# Patient Record
Sex: Male | Born: 1995
Health system: Southern US, Community
[De-identification: ages and names within clinical notes are randomized; demographics above are authoritative.]

## PROBLEM LIST (undated history)

## (undated) DIAGNOSIS — F32A Depression, unspecified: Secondary | ICD-10-CM

## (undated) DIAGNOSIS — F5105 Insomnia due to other mental disorder: Secondary | ICD-10-CM

## (undated) DIAGNOSIS — F419 Anxiety disorder, unspecified: Secondary | ICD-10-CM

## (undated) DIAGNOSIS — J302 Other seasonal allergic rhinitis: Secondary | ICD-10-CM

## (undated) DIAGNOSIS — F909 Attention-deficit hyperactivity disorder, unspecified type: Secondary | ICD-10-CM

## (undated) DIAGNOSIS — F329 Major depressive disorder, single episode, unspecified: Secondary | ICD-10-CM

## (undated) DIAGNOSIS — G43909 Migraine, unspecified, not intractable, without status migrainosus: Secondary | ICD-10-CM

## (undated) DIAGNOSIS — G4719 Other hypersomnia: Principal | ICD-10-CM

## (undated) HISTORY — DX: Other hypersomnia: G47.19

## (undated) HISTORY — DX: Insomnia due to other mental disorder: F51.05

---
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
## 2014-01-11 ENCOUNTER — Encounter (HOSPITAL_COMMUNITY): Payer: Self-pay | Admitting: *Deleted

## 2014-01-11 ENCOUNTER — Emergency Department (HOSPITAL_COMMUNITY)
Admission: EM | Admit: 2014-01-11 | Discharge: 2014-01-11 | Disposition: A | Discharge status: Home / Self Care | Payer: Federal, State, Local not specified - PPO | Attending: Emergency Medicine | Admitting: Emergency Medicine

## 2014-01-11 DIAGNOSIS — R1032 Left lower quadrant pain: Secondary | ICD-10-CM | POA: Diagnosis not present

## 2014-01-11 DIAGNOSIS — K5900 Constipation, unspecified: Secondary | ICD-10-CM | POA: Insufficient documentation

## 2014-01-11 DIAGNOSIS — R51 Headache: Secondary | ICD-10-CM | POA: Diagnosis not present

## 2014-01-11 DIAGNOSIS — R197 Diarrhea, unspecified: Secondary | ICD-10-CM | POA: Insufficient documentation

## 2014-01-11 DIAGNOSIS — Z8659 Personal history of other mental and behavioral disorders: Secondary | ICD-10-CM | POA: Insufficient documentation

## 2014-01-11 DIAGNOSIS — Z8669 Personal history of other diseases of the nervous system and sense organs: Secondary | ICD-10-CM | POA: Insufficient documentation

## 2014-01-11 HISTORY — DX: Depression, unspecified: F32.A

## 2014-01-11 HISTORY — DX: Major depressive disorder, single episode, unspecified: F32.9

## 2014-01-11 HISTORY — DX: Other seasonal allergic rhinitis: J30.2

## 2014-01-11 HISTORY — DX: Attention-deficit hyperactivity disorder, unspecified type: F90.9

## 2014-01-11 HISTORY — DX: Anxiety disorder, unspecified: F41.9

## 2014-01-11 HISTORY — DX: Migraine, unspecified, not intractable, without status migrainosus: G43.909

## 2014-01-11 LAB — CBC WITH DIFFERENTIAL/PLATELET
BASOS ABS: 0 10*3/uL (ref 0.0–0.1)
BASOS PCT: 0 % (ref 0–1)
EOS PCT: 1 % (ref 0–5)
Eosinophils Absolute: 0.1 10*3/uL (ref 0.0–0.7)
HEMATOCRIT: 43.7 % (ref 39.0–52.0)
Hemoglobin: 15.8 g/dL (ref 13.0–17.0)
LYMPHS ABS: 2.4 10*3/uL (ref 0.7–4.0)
LYMPHS PCT: 31 % (ref 12–46)
MCH: 31.5 pg (ref 26.0–34.0)
MCHC: 36.2 g/dL — ABNORMAL HIGH (ref 30.0–36.0)
MCV: 87.2 fL (ref 78.0–100.0)
MONOS PCT: 8 % (ref 3–12)
Monocytes Absolute: 0.6 10*3/uL (ref 0.1–1.0)
Neutro Abs: 4.6 10*3/uL (ref 1.7–7.7)
Neutrophils Relative %: 60 % (ref 43–77)
PLATELETS: 278 10*3/uL (ref 150–400)
RBC: 5.01 MIL/uL (ref 4.22–5.81)
RDW: 12.1 % (ref 11.5–15.5)
WBC: 7.7 10*3/uL (ref 4.0–10.5)

## 2014-01-11 LAB — URINALYSIS, ROUTINE W REFLEX MICROSCOPIC
Bilirubin Urine: NEGATIVE
GLUCOSE, UA: NEGATIVE mg/dL
HGB URINE DIPSTICK: NEGATIVE
Ketones, ur: NEGATIVE mg/dL
Leukocytes, UA: NEGATIVE
NITRITE: NEGATIVE
Protein, ur: NEGATIVE mg/dL
SPECIFIC GRAVITY, URINE: 1.008 (ref 1.005–1.030)
Urobilinogen, UA: 0.2 mg/dL (ref 0.0–1.0)
pH: 6.5 (ref 5.0–8.0)

## 2014-01-11 LAB — COMPREHENSIVE METABOLIC PANEL
ALBUMIN: 4.8 g/dL (ref 3.5–5.2)
ALK PHOS: 64 U/L (ref 39–117)
ALT: 14 U/L (ref 0–53)
AST: 18 U/L (ref 0–37)
Anion gap: 15 (ref 5–15)
BUN: 9 mg/dL (ref 6–23)
CALCIUM: 9.6 mg/dL (ref 8.4–10.5)
CO2: 24 meq/L (ref 19–32)
CREATININE: 0.85 mg/dL (ref 0.50–1.35)
Chloride: 101 mEq/L (ref 96–112)
Glucose, Bld: 97 mg/dL (ref 70–99)
Potassium: 3.4 mEq/L — ABNORMAL LOW (ref 3.7–5.3)
Sodium: 140 mEq/L (ref 137–147)
TOTAL PROTEIN: 8 g/dL (ref 6.0–8.3)
Total Bilirubin: 1.4 mg/dL — ABNORMAL HIGH (ref 0.3–1.2)

## 2014-01-11 LAB — LIPASE, BLOOD: LIPASE: 17 U/L (ref 11–59)

## 2014-01-11 MED ORDER — IBUPROFEN 800 MG PO TABS
800.0000 mg | ORAL_TABLET | Freq: Once | ORAL | Status: AC
  Administered 2014-01-11: 800 mg via ORAL
  Filled 2014-01-11: qty 1

## 2014-01-11 NOTE — ED Notes (Signed)
Pt reports sudden onset of LLQ pain that started this afternoon around 3pm. Followed by feeling weak, lightheaded and headache. Denies n/v/d or urinary symptoms.

## 2014-01-11 NOTE — ED Provider Notes (Signed)
CSN: [id number]     Arrival date & time 01/11/14  1828 History   First MD Initiated Contact with Patient 01/11/14 2055     Chief Complaint  Patient presents with  . Abdominal Pain  . Headache     (Consider location/radiation/quality/duration/timing/severity/associated sxs/prior Treatment) Patient is a 18 y.o. male presenting with abdominal pain and headaches. The history is provided by the patient. No language interpreter was used.  Abdominal Pain Pain location:  LLQ Pain quality: sharp   Pain radiates to:  Does not radiate Pain severity:  Severe Onset quality:  Sudden Duration:  7 hours Timing:  Intermittent Progression:  Improving Chronicity:  New Context: not previous surgeries, not recent illness, not recent sexual activity, not suspicious food intake and not trauma   Relieved by:  None tried Worsened by:  Nothing tried Associated symptoms: constipation and diarrhea   Associated symptoms: no anorexia, no chest pain, no chills, no dysuria, no fever, no hematemesis, no hematochezia, no hematuria, no melena, no nausea, no shortness of breath and no vomiting   Risk factors: has not had multiple surgeries and no NSAID use   Headache Pain location:  Generalized Quality:  Dull Radiates to:  Does not radiate Severity currently:  0/10 Onset quality:  Gradual Progression:  Resolved Chronicity:  Recurrent Similar to prior headaches: yes   Associated symptoms: abdominal pain and diarrhea   Associated symptoms: no fever, no myalgias, no nausea, no neck pain and no vomiting     Past Medical History  Diagnosis Date  . Migraine   . Anxiety   . Depression   . ADHD (attention deficit hyperactivity disorder)   . Seasonal allergies    History reviewed. No pertinent past surgical history. History reviewed. No pertinent family history. History  Substance Use Topics  . Smoking status: Not on file  . Smokeless tobacco: Not on file  . Alcohol Use: No    Review of Systems   Constitutional: Negative for fever, chills and appetite change.  Eyes: Negative for visual disturbance.  Respiratory: Negative for chest tightness and shortness of breath.   Cardiovascular: Negative for chest pain and palpitations.  Gastrointestinal: Positive for abdominal pain, diarrhea and constipation. Negative for nausea, vomiting, blood in stool, melena, hematochezia, abdominal distention, rectal pain, anorexia and hematemesis.  Genitourinary: Negative for dysuria, hematuria, discharge, penile swelling, scrotal swelling and testicular pain.  Musculoskeletal: Negative for myalgias, arthralgias and neck pain.  Neurological: Positive for light-headedness (resolved) and headaches.  All other systems reviewed and are negative.     Allergies  Review of patient's allergies indicates no known allergies.  Home Medications   Prior to Admission medications   Not on File   BP 123/75 mmHg  Pulse 81  Temp(Src) 98.1 F (36.7 C) (Oral)  Resp 15  SpO2 99% Physical Exam  Constitutional: He is oriented to person, place, and time. He appears well-developed and well-nourished. No distress.  HENT:  Head: Normocephalic.  Mouth/Throat: Oropharynx is clear and moist.  Eyes: EOM are normal. Pupils are equal, round, and reactive to light.  Neck: Neck supple.  Cardiovascular: Normal rate, regular rhythm and intact distal pulses.   Pulmonary/Chest: Effort normal and breath sounds normal. He exhibits no tenderness.  Abdominal: Soft. He exhibits no distension. There is tenderness (mild) in the left lower quadrant. There is no rigidity, no rebound, no guarding and no CVA tenderness. No hernia.  Genitourinary: Testes normal and penis normal. Cremasteric reflex is present. Circumcised.  Neurological: He is alert and oriented  to person, place, and time. He has normal strength. No cranial nerve deficit or sensory deficit. Coordination and gait normal. GCS eye subscore is 4. GCS verbal subscore is 5. GCS  motor subscore is 6.  Skin: Skin is warm and dry.  Vitals reviewed.   ED Course  Procedures (including critical care time) Labs Review Labs Reviewed  CBC WITH DIFFERENTIAL - Abnormal; Notable for the following:    MCHC 36.2 (*)    All other components within normal limits  COMPREHENSIVE METABOLIC PANEL - Abnormal; Notable for the following:    Potassium 3.4 (*)    Total Bilirubin 1.4 (*)    All other components within normal limits  LIPASE, BLOOD  URINALYSIS, ROUTINE W REFLEX MICROSCOPIC    Imaging Review No results found.   EKG Interpretation None      MDM   Final diagnoses:  LLQ abdominal pain    18 y/o male with LLQ abdominal pain. Now improved. No vomiting, fever, testicular complaints. Does have h/o intermittent constipation with hard BM today. Benign abdominal exam with very minimal tenderness in LLQ. Normal GU exam. Imaging unlikely to add to evaluation and felt nonindicated. Labs unremarkable. No s/s of nephrolithiasis. Recommended miralax for constipation. Appropriately screened and will f/u with PCP if symptoms persist or return to ED. Questions answered and family in agreement.     Abagail KitchensMegan Jlee Harkless, MD 01/12/14 1550  Nelia Shiobert L Beaton, MD 01/13/14 [phone]

## 2014-01-11 NOTE — Discharge Instructions (Signed)

## 2014-07-05 ENCOUNTER — Ambulatory Visit (INDEPENDENT_AMBULATORY_CARE_PROVIDER_SITE_OTHER): Payer: Federal, State, Local not specified - PPO | Admitting: Neurology

## 2014-07-05 ENCOUNTER — Encounter: Payer: Self-pay | Admitting: Neurology

## 2014-07-05 VITALS — BP 111/71 | HR 90 | Temp 97.8°F | Resp 18 | Ht 70.08 in | Wt 151.5 lb

## 2014-07-05 DIAGNOSIS — F5105 Insomnia due to other mental disorder: Secondary | ICD-10-CM | POA: Diagnosis not present

## 2014-07-05 DIAGNOSIS — G4719 Other hypersomnia: Secondary | ICD-10-CM | POA: Diagnosis not present

## 2014-07-05 DIAGNOSIS — F489 Nonpsychotic mental disorder, unspecified: Secondary | ICD-10-CM

## 2014-07-05 DIAGNOSIS — R0683 Snoring: Secondary | ICD-10-CM | POA: Diagnosis not present

## 2014-07-05 HISTORY — DX: Other hypersomnia: G47.19

## 2014-07-05 HISTORY — DX: Insomnia due to other mental disorder: F51.05

## 2014-07-05 NOTE — Progress Notes (Signed)
SLEEP MEDICINE CLINIC   Provider:  Melvyn Novasarmen  Esli Jernigan, M D  Referring Provider: Tally JoeSwayne, David, MD Primary Care Physician:  [name],DAVID W, MD  Chief Complaint  Patient presents with  . Fatigue  . Insomnia    HPI:  Jeffery Johnson is a 19 y.o. male , seen here with his mother  as a referral from [facility] Dr. Phillip HealJane Steiner, his PCP is Dr. Azucena CecilSwayne , for insomnia.  Mr. Jeffery Johnson presents today in a hypersonic state but this is due to not sleeping last night. Apparently he has had trouble with insomnia trouble to initiate sleep and maintain sleep for quite a while. He has been treated since his youth for anxiety disorder. He reports that he often flurries or has racing thoughts keeping him from finally being able to go to sleep, but often once he is asleep he wakes up earlier than desired , too. His mother reports that he was always a poor sleeper, he had trouble sleeping as an infant. The anxiety disorder manifested itself already doing an elementary school age. Recently he had been prescribed doxepin to help him induce sleep. He had not felt that up to 10 mg of melatonin were of great help in this regard. He states that the doxepin helps him to sleep but he has morning grogginess for about 2 hours after he rises. He works late shift ,   His sleep habits are as follows; The patient has his own bedroom, he uses blackout curtains to eliminate light and flux, he closes the door at night usually his bedroom would be described as core, quiet and dark. He usually goes to the bedroom at about 21 and 3 AM, he may go to sleep finally at 5 AM on some days. He will sleep through until 2 or 3 in the afternoon. his work ends at [employer] PM and he usually visits his girlfriend after that. He denies any alcohol use or caffeine use during work days. He gets about 6 hor to 8 hours of sleep. He snores, or at least has audible breathing.  His girlfriend reported him to have apnea.  On days that he has to work earlier he  would go home directly after work and then it'll take him only 15-30 minutes to fall asleep. When he feels [name] or worried about something he has great trouble to go to sleep which is expected. He actually had more trouble to get a good nights rest when he was in school and he has now. I do think that the medication seems to work rather well for him at this time and that he should continue it. Under the use of doxepin he has not had fragmented sleep. But he used to have sleep fragmentation before.  His father has insomnia and uses Ambien every night.     Review of Systems: Out of a complete 14 system review, the patient complains of only the following symptoms, and all other reviewed systems are negative. Insomia, depression, anxiety, racing thoughts.  snoring    Epworth score 8  , Fatigue severity score 32  , depression score N/A    History   Social History  . Marital Status: Single    Spouse Name: N/A  . Number of Children: N/A  . Years of Education: N/A   Occupational History  . Not on file.   Social History Main Topics  . Smoking status: Never Smoker   . Smokeless tobacco: Not on file  . Alcohol Use: No  Comment: 1 drink a week  . Drug Use: No  . Sexual Activity: Not on file   Other Topics Concern  . Not on file   Social History Narrative   2 drinks of caffeine a week.    Family History  Problem Relation Age of Onset  . Breast cancer Mother   . Skin cancer Mother   . Stroke Maternal Grandfather   . Heart disease Maternal Grandfather   . Diabetes Maternal Grandfather   . Stroke Paternal Grandfather   . Heart disease Paternal Grandfather     Past Medical History  Diagnosis Date  . Migraine   . Anxiety   . Depression   . ADHD (attention deficit hyperactivity disorder)   . Seasonal allergies   . Excessive daytime sleepiness 07/05/2014  . Insomnia due to mental condition 07/05/2014    History reviewed. No pertinent past surgical history.  Current  Outpatient Prescriptions  Medication Sig Dispense Refill  . busPIRone (BUSPAR) 30 MG tablet Take 30 mg by mouth every morning.    . clonazePAM (KLONOPIN) 0.5 MG tablet Take 0.5 mg by mouth 2 (two) times daily as needed for anxiety.    Marland Kitchen doxepin (SINEQUAN) 10 MG capsule Take 10 mg by mouth at bedtime. 1-2 tabs about 1 hour before bed, may increase up to 3 tabs qhs for sleep    . escitalopram (LEXAPRO) 20 MG tablet Take 30 mg by mouth every morning. Take 1 and 1/2 tab ( ) daily    . lactobacillus acidophilus (BACID) TABS tablet Take 2 tablets by mouth 3 (three) times daily.    Marland Kitchen lisdexamfetamine (VYVANSE) 40 MG capsule Take 40 mg by mouth every morning.    . Melatonin 1 MG TABS Take 1 tablet by mouth at bedtime as needed (sleep).    . montelukast (SINGULAIR) 10 MG tablet Take 10 mg by mouth at bedtime.    . Multiple Vitamin (MULTIVITAMIN) tablet Take 1 tablet by mouth daily.    Marland Kitchen ZOLMitriptan (ZOMIG) 2.5 MG tablet Take 2.5 mg by mouth as needed for migraine or headache.    . zonisamide (ZONEGRAN) 100 MG capsule Take 300 mg by mouth at bedtime.     No current facility-administered medications for this visit.    Allergies as of 07/05/2014  . (No Known Allergies)    Vitals: BP 111/71 mmHg  Pulse 90  Temp(Src) 97.8 F (36.6 C) (Oral)  Resp 18  Ht 5' 10.08" (1.78 m)  Wt 151 lb 8 oz (68.72 kg)  BMI 21.69 kg/m2 Last Weight:  Wt Readings from Last 1 Encounters:  07/05/14 151 lb 8 oz (68.72 kg)       Last Height:   Ht Readings from Last 1 Encounters:  07/05/14 5' 10.08" (1.78 m)    Physical exam:  General: The patient is awake, alert and appears not in acute distress. The patient is well groomed. Head: Normocephalic, atraumatic. Neck is supple. Mallampati 2   neck circumference: 14.5 . Nasal airflow unrestricted , TMJ is not  evident . Retrognathia is seen.  He wore braces .  Cardiovascular:  Regular rate and rhythm , without  murmurs or carotid bruit, and without distended neck  veins. Respiratory: Lungs are clear to auscultation. Skin:  Without evidence of edema, or rash Trunk: BMI is normal, as is  posture.  Neurologic exam : The patient is awake and alert, oriented to place and time.   Memory subjective described as intact. There is a normal attention span & concentration ability.  Speech is fluent without dysarthria, dysphonia or aphasia.  Mood and affect are appropriate.  Cranial nerves: Pupils are equal and briskly reactive to light. Funduscopic exam without  evidence of pallor or edema. Extraocular movements  in vertical and horizontal planes intact and without nystagmus. Visual fields by finger perimetry are intact. Hearing to finger rub intact.  Facial sensation intact to fine touch. Facial motor strength is symmetric and tongue and uvula move midline.  Motor exam:   Normal tone, muscle bulk and symmetric ,strength in all extremities.  Sensory:  Fine touch, pinprick and vibration were tested in all extremities. Proprioception is  normal.  Coordination: Rapid alternating movements in the fingers/hands is normal. Finger-to-nose maneuver normal without evidence of ataxia, dysmetria or tremor.  Gait and station: Patient walks without assistive device and is able unassisted to climb up to the exam table.  Strength within normal limits. Stance is stable and normal. Tandem gait is unfragmented. Romberg  negative.  Deep tendon reflexes: in the  upper and lower extremities are symmetric, brisk  and intact. Babinski maneuver response is downgoing.   Assessment:  After physical and neurologic examination, review of laboratory studies, imaging, neurophysiology testing and pre-existing records, assessment is   1) I think Mr. Zahradnik insomnia is strongly related to his underlying anxiety. Doxepin treats anxiety and depression as well as insomnia and he has responded very well to it. I would like for him to stay on this medication it is not addictive, he does not  complain of a dry mouth and the grogginess he seems to be able to handle. In addition once he is asleep he seems to have less fragmented sleep taking this medication. 2) His girlfriend has reported him to snore and she has witnessed some apneas or irregular breathing and his mother states that he even as an infant had some apneic episodes. It may be worse monitoring this I'm not sure if we should do a home sleep test or an in lab sleep test. I would usually prefer for somebody who has a irregular sleep/ shift work to do a home sleep test  As it is easier to arrange. [employer].    The patient was advised of the nature of the diagnosed sleep disorder , the treatment options and risks for general a health and wellness arising from not treating the condition. Visit duration was 35 minutes.   Plan:  Treatment plan and additional workup : If permitted by the federal [employer] plan I would like for this patient to have an apneas screening test by home sleep test. As to his insomnia I doubt that that is a organic component of snoring or apnea that would cause it. Dr. sinus medication choice seems to have treated the insomnia rather well and I see no reason for him to change from that.      Porfirio Mylar Latash Nouri MD  07/05/2014

## 2014-07-05 NOTE — Patient Instructions (Signed)
Insomnia Insomnia is frequent trouble falling and/or staying asleep. Insomnia can be a long term problem or a short term problem. Both are common. Insomnia can be a short term problem when the wakefulness is related to a certain stress or worry. Long term insomnia is often related to ongoing stress during waking hours and/or poor sleeping habits. Overtime, sleep deprivation itself can make the problem worse. Every little thing feels more severe because you are overtired and your ability to cope is decreased. CAUSES   Stress, anxiety, and depression.  Poor sleeping habits.  Distractions such as TV in the bedroom.  Naps close to bedtime.  Engaging in emotionally charged conversations before bed.  Technical reading before sleep.  Alcohol and other sedatives. They may make the problem worse. They can hurt normal sleep patterns and normal dream activity.  Stimulants such as caffeine for several hours prior to bedtime.  Pain syndromes and shortness of breath can cause insomnia.  Exercise late at night.  Changing time zones may cause sleeping problems (jet lag). It is sometimes helpful to have someone observe your sleeping patterns. They should look for periods of not breathing during the night (sleep apnea). They should also look to see how long those periods last. If you live alone or observers are uncertain, you can also be observed at a sleep clinic where your sleep patterns will be professionally monitored. Sleep apnea requires a checkup and treatment. Give your caregivers your medical history. Give your caregivers observations your family has made about your sleep.  SYMPTOMS   Not feeling rested in the morning.  Anxiety and restlessness at bedtime.  Difficulty falling and staying asleep. TREATMENT   Your caregiver may prescribe treatment for an underlying medical disorders. Your caregiver can give advice or help if you are using alcohol or other drugs for self-medication. Treatment  of underlying problems will usually eliminate insomnia problems.  Medications can be prescribed for short time use. They are generally not recommended for lengthy use.  Over-the-counter sleep medicines are not recommended for lengthy use. They can be habit forming.  You can promote easier sleeping by making lifestyle changes such as:  Using relaxation techniques that help with breathing and reduce muscle tension.  Exercising earlier in the day.  Changing your diet and the time of your last meal. No night time snacks.  Establish a regular time to go to bed.  Counseling can help with stressful problems and worry.  Soothing music and white noise may be helpful if there are background noises you cannot remove.  Stop tedious detailed work at least one hour before bedtime. HOME CARE INSTRUCTIONS   Keep a diary. Inform your caregiver about your progress. This includes any medication side effects. See your caregiver regularly. Take note of:  Times when you are asleep.  Times when you are awake during the night.  The quality of your sleep.  How you feel the next day. This information will help your caregiver care for you.  Get out of bed if you are still awake after 15 minutes. Read or do some quiet activity. Keep the lights down. Wait until you feel sleepy and go back to bed.  Keep regular sleeping and waking hours. Avoid naps.  Exercise regularly.  Avoid distractions at bedtime. Distractions include watching television or engaging in any intense or detailed activity like attempting to balance the household checkbook.  Develop a bedtime ritual. Keep a familiar routine of bathing, brushing your teeth, climbing into bed at the same   time each night, listening to soothing music. Routines increase the success of falling to sleep faster.  Use relaxation techniques. This can be using breathing and muscle tension release routines. It can also include visualizing peaceful scenes. You can  also help control troubling or intruding thoughts by keeping your mind occupied with boring or repetitive thoughts like the old concept of counting sheep. You can make it more creative like imagining planting one beautiful flower after another in your backyard garden.  During your day, work to eliminate stress. When this is not possible use some of the previous suggestions to help reduce the anxiety that accompanies stressful situations. MAKE SURE YOU:   Understand these instructions.  Will watch your condition.  Will get help right away if you are not doing well or get worse. Document Released: 02/20/2000 Document Revised: 05/17/2011 Document Reviewed: 03/22/2007 [employer] Patient Information 2015 [employer]. This information is not intended to replace advice given to you by your health care provider. Make sure you discuss any questions you have with your health care provider.  

## 2014-08-26 ENCOUNTER — Telehealth: Payer: Self-pay | Admitting: Neurology

## 2014-08-26 NOTE — Telephone Encounter (Signed)
Patients mother called regarding a sleep study the patient was supposed to have scheduled. Please call and advise.

## 2014-10-07 ENCOUNTER — Encounter (INDEPENDENT_AMBULATORY_CARE_PROVIDER_SITE_OTHER): Payer: Federal, State, Local not specified - PPO | Admitting: Neurology

## 2014-10-07 DIAGNOSIS — G4719 Other hypersomnia: Secondary | ICD-10-CM

## 2014-10-07 DIAGNOSIS — R0683 Snoring: Secondary | ICD-10-CM

## 2014-10-07 DIAGNOSIS — F5105 Insomnia due to other mental disorder: Secondary | ICD-10-CM

## 2014-10-07 DIAGNOSIS — G4710 Hypersomnia, unspecified: Secondary | ICD-10-CM | POA: Diagnosis not present

## 2014-10-24 ENCOUNTER — Telehealth: Payer: Self-pay

## 2014-10-24 NOTE — Telephone Encounter (Signed)
Spoke to mother (per DPR) regarding pt's sleep study. Advised her that the HST did not reveal evidence for central or obstructive sleep apnea, PLMs, oxygen desaturation and no heart rate abnormalities and there is no organic cause for insomnia and no explanation for the pt's headaches. I advised that Dr. Vickey Huger recommended a psychology referral for pt's insomnia. However, pt has a psychologist and psychiatrist already and pt's mother asked that I fax the results to Dr. Madaline Guthrie and to Dr Abundio Miu.

## 2015-07-01 DIAGNOSIS — F341 Dysthymic disorder: Secondary | ICD-10-CM | POA: Diagnosis not present

## 2015-07-07 DIAGNOSIS — F341 Dysthymic disorder: Secondary | ICD-10-CM | POA: Diagnosis not present

## 2015-07-17 DIAGNOSIS — Z0000 Encounter for general adult medical examination without abnormal findings: Secondary | ICD-10-CM | POA: Diagnosis not present

## 2015-07-22 DIAGNOSIS — G4700 Insomnia, unspecified: Secondary | ICD-10-CM | POA: Diagnosis not present

## 2015-07-22 DIAGNOSIS — Z0000 Encounter for general adult medical examination without abnormal findings: Secondary | ICD-10-CM | POA: Diagnosis not present

## 2015-07-22 DIAGNOSIS — Z1389 Encounter for screening for other disorder: Secondary | ICD-10-CM | POA: Diagnosis not present

## 2015-07-22 DIAGNOSIS — J302 Other seasonal allergic rhinitis: Secondary | ICD-10-CM | POA: Diagnosis not present

## 2015-07-22 DIAGNOSIS — K219 Gastro-esophageal reflux disease without esophagitis: Secondary | ICD-10-CM | POA: Diagnosis not present

## 2015-07-22 DIAGNOSIS — F325 Major depressive disorder, single episode, in full remission: Secondary | ICD-10-CM | POA: Diagnosis not present

## 2015-07-23 DIAGNOSIS — J4520 Mild intermittent asthma, uncomplicated: Secondary | ICD-10-CM | POA: Diagnosis not present

## 2015-07-23 DIAGNOSIS — D802 Selective deficiency of immunoglobulin A [IgA]: Secondary | ICD-10-CM | POA: Diagnosis not present

## 2015-07-23 DIAGNOSIS — J301 Allergic rhinitis due to pollen: Secondary | ICD-10-CM | POA: Diagnosis not present

## 2015-07-23 DIAGNOSIS — F341 Dysthymic disorder: Secondary | ICD-10-CM | POA: Diagnosis not present

## 2015-07-23 DIAGNOSIS — J3089 Other allergic rhinitis: Secondary | ICD-10-CM | POA: Diagnosis not present

## 2015-08-07 DIAGNOSIS — G43019 Migraine without aura, intractable, without status migrainosus: Secondary | ICD-10-CM | POA: Diagnosis not present

## 2015-08-07 DIAGNOSIS — G43719 Chronic migraine without aura, intractable, without status migrainosus: Secondary | ICD-10-CM | POA: Diagnosis not present

## 2015-08-19 DIAGNOSIS — K080 Exfoliation of teeth due to systemic causes: Secondary | ICD-10-CM | POA: Diagnosis not present

## 2015-08-20 DIAGNOSIS — F411 Generalized anxiety disorder: Secondary | ICD-10-CM | POA: Diagnosis not present

## 2015-08-20 DIAGNOSIS — F900 Attention-deficit hyperactivity disorder, predominantly inattentive type: Secondary | ICD-10-CM | POA: Diagnosis not present

## 2015-08-20 DIAGNOSIS — F332 Major depressive disorder, recurrent severe without psychotic features: Secondary | ICD-10-CM | POA: Diagnosis not present

## 2015-09-18 DIAGNOSIS — K080 Exfoliation of teeth due to systemic causes: Secondary | ICD-10-CM | POA: Diagnosis not present

## 2015-09-18 DIAGNOSIS — J029 Acute pharyngitis, unspecified: Secondary | ICD-10-CM | POA: Diagnosis not present

## 2015-10-23 ENCOUNTER — Ambulatory Visit (INDEPENDENT_AMBULATORY_CARE_PROVIDER_SITE_OTHER): Payer: Federal, State, Local not specified - PPO | Admitting: Pulmonary Disease

## 2015-10-23 DIAGNOSIS — G4730 Sleep apnea, unspecified: Secondary | ICD-10-CM

## 2015-10-23 DIAGNOSIS — G4710 Hypersomnia, unspecified: Secondary | ICD-10-CM | POA: Diagnosis not present

## 2015-10-23 NOTE — Patient Instructions (Addendum)
  It was a pleasure taking care of you today.   5 Rules of Good Sleep Hygiene: 1. Go to bed only when sleepy. 2. Try to wake up at the same time everyday.  3. If you are not able to fall asleep within the hour, get up and do something boring and monotonous.  Continue doing this until you get sleepy and you fall asleep.  4. Avoid naps. 5. The bed should just be used for sleep and intimacy.    Please follow-up with your psychiatrist.  Adjust medications as we had discussed.  Return to clinic in 3 mos.

## 2015-10-23 NOTE — Progress Notes (Signed)
Subjective:    Patient ID: Jeffery Johnson, male    DOB: 07/22/1995, 20 y.o.   MRN: [id number]  HPI    Pt. Presents for consultation for witnessed apnea while sleeping referred by Dr. Santiago GladMarshall Johnson. Pt. States he has trouble going to sleep, and often times feels wide awake when goes to bed. Usually goes to bed between 12 am and 2 am.It takes him anywhere from 30 minutes to 2 hours to go to sleep. He takes Melatonin 10 mg, Benadryl 50 mg, and 1 mg of Klonopin. To help with sleep. He gets up between 9 am and 10 am. He wakes 0-2 times per night.He does not drive commercially or operate heavy equipment. He has a history of mild asthma diagnosed in 2006. He uses a Psychologist, forensicro Air inhaler as needed. He needs it more when he is sick or exercising. He has had a weight gain of about 15 pounds in the last 2 years.He does have sinus trouble and migrane headaches since he was 20 years old. He states he has daytime sleepiness. He feels rested 80% of the time upon wakening.Both maternal aunt and half brother are CPAP users. He states that he jerks a lot in his sleep.He takes buspar, and Lexapro, for anxiety, anger management, depression and ADHD. Home Sleep Study done 10/2014 indicates 0 AHI, but must question accuracy as the patient has had witnessed apnea   Epworth Score =5   Current Outpatient Prescriptions:  .  busPIRone (BUSPAR) 30 MG tablet, Take 30 mg by mouth every morning., Disp: , Rfl:  .  cetirizine (ZYRTEC) 10 MG tablet, Take 10 mg by mouth daily., Disp: , Rfl:  .  clonazePAM (KLONOPIN) 0.5 MG tablet, Take 0.5 mg by mouth 2 (two) times daily as needed for anxiety., Disp: , Rfl:  .  escitalopram (LEXAPRO) 20 MG tablet, Take 30 mg by mouth daily., Disp: , Rfl:  .  lactobacillus acidophilus (BACID) TABS tablet, Take 2 tablets by mouth 3 (three) times daily., Disp: , Rfl:  .  lisdexamfetamine (VYVANSE) 50 MG capsule, Take 50 mg by mouth daily., Disp: , Rfl:  .  Melatonin 1 MG TABS, Take 1 tablet by mouth at  bedtime as needed (sleep)., Disp: , Rfl:  .  montelukast (SINGULAIR) 10 MG tablet, Take 10 mg by mouth at bedtime., Disp: , Rfl:  .  Multiple Vitamin (MULTIVITAMIN) tablet, Take 1 tablet by mouth daily., Disp: , Rfl:  .  omeprazole (PRILOSEC) 20 MG capsule, Take 20 mg by mouth daily., Disp: , Rfl:  .  Probiotic Product (ALIGN PO), Take by mouth., Disp: , Rfl:  .  ZOLMitriptan (ZOMIG) 2.5 MG tablet, Take 2.5 mg by mouth as needed for migraine or headache., Disp: , Rfl:  .  zonisamide (ZONEGRAN) 100 MG capsule, Take 300 mg by mouth at bedtime., Disp: , Rfl:    family history includes Breast cancer in his mother; Diabetes in his maternal grandfather; Heart disease in his maternal grandfather and paternal grandfather; Skin cancer in his mother; Stroke in his maternal grandfather and paternal grandfather.   Social History   Social History  . Marital status: Single    Spouse name: N/A  . Number of children: N/A  . Years of education: N/A   Social History Main Topics  . Smoking status: Never Smoker  . Smokeless tobacco: Not on file  . Alcohol use No     Comment: 1 drink a week  . Drug use: No  . Sexual activity: Not  on file   Other Topics Concern  . Not on file   Social History Narrative   2 drinks of caffeine a week.    Past Medical History:  Diagnosis Date  . ADHD (attention deficit hyperactivity disorder)   . Anxiety   . Depression   . Excessive daytime sleepiness 07/05/2014  . Insomnia due to mental condition 07/05/2014  . Migraine   . Seasonal allergies     Review of Systems  Constitutional: Negative.  Negative for fever and unexpected weight change.  HENT: Positive for congestion and postnasal drip. Negative for dental problem, ear pain, nosebleeds, rhinorrhea, sinus pressure, sneezing, sore throat and trouble swallowing.   Eyes: Negative.  Negative for redness and itching.  Respiratory: Positive for cough, chest tightness and wheezing. Negative for shortness of breath.     Cardiovascular: Negative.  Negative for palpitations and leg swelling.  Gastrointestinal: Negative.  Negative for nausea and vomiting.  Endocrine: Negative.   Genitourinary: Negative.  Negative for dysuria.  Musculoskeletal: Positive for back pain. Negative for joint swelling.  Skin: Negative.  Negative for rash.  Allergic/Immunologic: Positive for environmental allergies.  Neurological: Positive for headaches.  Hematological: Negative.  Does not bruise/bleed easily.  Psychiatric/Behavioral: Positive for behavioral problems and sleep disturbance. Negative for dysphoric mood. The patient is not nervous/anxious.        Objective:   Physical Exam  BP 118/78 (BP Location: Left Arm, Cuff Size: Normal)   Pulse (!) 107   SpO2 98%    Physical Exam:  General- No distress,  A&Ox3 ENT: No sinus tenderness, TM clear, pale nasal mucosa, no oral exudate,no post nasal drip, no LAN Cardiac: S1, S2, regular rate and rhythm, no murmur Chest: No wheeze/ rales/ dullness; no accessory muscle use, no nasal flaring, no sternal retractions Abd.: Soft Non-tender Ext: No clubbing cyanosis, edema Neuro:  normal strength Skin: No rashes, warm and dry Psych: normal mood and behavior       Assessment & Plan:   Hypersomnia Witnessed sleep apnea by family members Home Sleep Study Inconclusive  Plan:   Jeffery Johnson, AGACNP-BC [facility] 10/23/2015    ATTENDING NOTE / ATTESTATION NOTE :   I have discussed the case with the resident/APP Jeffery Johnson.    I agree with the resident/APP's  history, physical examination, assessment, and plans.    I have edited the above note and modified it according to our agreed history, physical examination, assessment and plan.   Patient's chief complaint was "I have difficulty falling asleep"  During weekdays, patient goes to bed at around 11  pm. He tosses and turns. Denies doing other stuff in bed.  He ends up falling asleep  at 1-2 am.  He usually sleeps throughout the night, occasional awakening.  Some witnessed apneas and snoring by girlfriend. Denies choking, gasping. He wakes up just before 9 am as he has to be at school at 9 am. He feels unrefreshed when he wakes up.  He ends up sleeping around 7 hrs during weekdays.  He is in school usually from 9 am until 3 pm.  He usually goes home after school.  He then takes a 1 hr nap usually in the afternoon, at around 5 pm, as he feels exhausted by this time.   During weekdays, he goes out and ends up going home at around 2 am or 3 am.  No issues falling asleep during weekends.  He ends up falling asleep within 30 minutes.  He  gets up at around 12 noon.  Denies napping during the weekend.   Pt has mental health issues for which he sees Dr. Warren Danes. She gives him sleeping meds/aids. He has depression, anxiety, ADHD.  Over all, these are stable. Recent mild flare up of anxiety 2/2 stress at school. He denies being suicidal.   Pt takes the following : 1. Melatonin.  He does not think this helps much.Takes it at night.  2. Recently started on Clonazepam BID.  Recent anxiety with school.  3. Buspar, Lexapro, Vyvanse. 4. He tried Doxepin and it did not work.   Pt goes to college. He has a girlfriend. Denies smoking cigarettes. Used recreational drugs before. Drinks alcohol during weekends.   Assessment and Plan: 1. Insomnia, sleep onset 2/2 inadequate sleep habits/poor sleep hygiene.  Patient has his sleep schedule all over the place. He is like a "teenager" as far as his sleep schedule is concerned. We extensively discussed the 5 rules to good sleep hygiene:  5 Rules of Good Sleep Hygiene: 1. Go to bed only when sleepy. 2. Try to wake up at the same time everyday.  3. If you are not able to fall asleep within the hour, get up and do something boring and monotonous.  Continue doing this until you get sleepy and you fall asleep.  4. Avoid naps. 5. The bed should just be  used for sleep and intimacy.   At the end of the day, I told the patient that these rules are hard to follow.  If he really thinks that there is a problem with his sleep schedule, then he should make the change and have good sleep habits.  He understands the reason why he feels he is not getting enough sleep. Unless he realizes that there is a problem with his sleep habits, he will not make the change and adjustments.  Fortunately for him, he is still in college and he is not someone who has to wake up at 7 am to be at work by 8 am.  I foresee at that point, patient will be forced to "fix" his sleep schedule.   We discussed about winding down 30 minutes before going to bed. His room environment is conducive to sleeping.  He is not doing other things in bed while trying to fall asleep.   I mentioned to the pt that 7-8 hrs of sleep should be enough.  By history, he gets 7-8 hrs of sleep. He is also not a long sleeper by history.   I briefly mentioned about CBT if his issues get out of hand to the point of it affecting his school performance. His psychiatrist  may be able to help him with CBT if needed.   2. Hypersomnia. This is related to #1. He had a home sleep test which was not accurate as some leads/monitors were off when he did it.  If ever he has OSA, likely it is mild at this point.  Besides, pt does not want CPAP yet.  Advised pt to watch out for worsening symptoms. He will let us know if he is more symptomatic.  Will need a lab study at that point.   3. Depression/Anxiety/ADHD. He follows with Dr Warren Danes q 3 mos.  He is on several meds for these issues.  Suggest taking stimulating meds in am and sedating meds at [facility] which he does.  We discussed about when to take melatonin. I suggested to try to wean off clonazepam if  ever.  He may try benadryl every now and then to help him sleep as he is adjusting his sleep schedule/correcting his sleep habit.   Thank you very much for letting me  participate in this patient's care. Please do not hesitate to give me a call if you have any questions or concerns regarding the treatment plan.   Patient will follow up with me in 3 months. Sooner if with issues.     Pollie Meyer, MD 10/24/2015   5:34 AM Pulmonary and Critical Care Medicine [facility] Pager: [phone] Office: [phone], Fax: [phone]     J. Alexis Frock, MD 10/24/2015, 4:58 AM [facility] Pager [phone] After 3 pm or if no answer, call [phone]

## 2015-10-24 ENCOUNTER — Telehealth: Payer: Self-pay | Admitting: Pulmonary Disease

## 2015-10-24 NOTE — Telephone Encounter (Signed)
This encounter was placed in error.

## 2015-10-28 DIAGNOSIS — H9201 Otalgia, right ear: Secondary | ICD-10-CM | POA: Diagnosis not present

## 2015-11-06 DIAGNOSIS — F341 Dysthymic disorder: Secondary | ICD-10-CM | POA: Diagnosis not present

## 2015-11-06 DIAGNOSIS — F900 Attention-deficit hyperactivity disorder, predominantly inattentive type: Secondary | ICD-10-CM | POA: Diagnosis not present

## 2015-11-06 DIAGNOSIS — F411 Generalized anxiety disorder: Secondary | ICD-10-CM | POA: Diagnosis not present

## 2015-11-06 DIAGNOSIS — F332 Major depressive disorder, recurrent severe without psychotic features: Secondary | ICD-10-CM | POA: Diagnosis not present

## 2015-11-13 DIAGNOSIS — F341 Dysthymic disorder: Secondary | ICD-10-CM | POA: Diagnosis not present

## 2015-11-18 DIAGNOSIS — F341 Dysthymic disorder: Secondary | ICD-10-CM | POA: Diagnosis not present

## 2015-11-19 DIAGNOSIS — F332 Major depressive disorder, recurrent severe without psychotic features: Secondary | ICD-10-CM | POA: Diagnosis not present

## 2015-11-19 DIAGNOSIS — F411 Generalized anxiety disorder: Secondary | ICD-10-CM | POA: Diagnosis not present

## 2015-11-19 DIAGNOSIS — G4700 Insomnia, unspecified: Secondary | ICD-10-CM | POA: Diagnosis not present

## 2015-11-19 DIAGNOSIS — F4323 Adjustment disorder with mixed anxiety and depressed mood: Secondary | ICD-10-CM | POA: Diagnosis not present

## 2015-11-20 DIAGNOSIS — F418 Other specified anxiety disorders: Secondary | ICD-10-CM | POA: Diagnosis not present

## 2015-11-20 DIAGNOSIS — M545 Low back pain: Secondary | ICD-10-CM | POA: Diagnosis not present

## 2015-11-20 DIAGNOSIS — G43909 Migraine, unspecified, not intractable, without status migrainosus: Secondary | ICD-10-CM | POA: Diagnosis not present

## 2015-11-20 DIAGNOSIS — F341 Dysthymic disorder: Secondary | ICD-10-CM | POA: Diagnosis not present

## 2015-11-20 DIAGNOSIS — G4709 Other insomnia: Secondary | ICD-10-CM | POA: Diagnosis not present

## 2015-11-25 DIAGNOSIS — F341 Dysthymic disorder: Secondary | ICD-10-CM | POA: Diagnosis not present

## 2015-11-27 DIAGNOSIS — F341 Dysthymic disorder: Secondary | ICD-10-CM | POA: Diagnosis not present

## 2015-12-01 DIAGNOSIS — F341 Dysthymic disorder: Secondary | ICD-10-CM | POA: Diagnosis not present

## 2015-12-08 DIAGNOSIS — F418 Other specified anxiety disorders: Secondary | ICD-10-CM | POA: Diagnosis not present

## 2015-12-08 DIAGNOSIS — G43909 Migraine, unspecified, not intractable, without status migrainosus: Secondary | ICD-10-CM | POA: Diagnosis not present

## 2015-12-08 DIAGNOSIS — G4709 Other insomnia: Secondary | ICD-10-CM | POA: Diagnosis not present

## 2015-12-08 DIAGNOSIS — M545 Low back pain: Secondary | ICD-10-CM | POA: Diagnosis not present

## 2015-12-11 DIAGNOSIS — G4709 Other insomnia: Secondary | ICD-10-CM | POA: Diagnosis not present

## 2015-12-11 DIAGNOSIS — M545 Low back pain: Secondary | ICD-10-CM | POA: Diagnosis not present

## 2015-12-11 DIAGNOSIS — G43909 Migraine, unspecified, not intractable, without status migrainosus: Secondary | ICD-10-CM | POA: Diagnosis not present

## 2015-12-11 DIAGNOSIS — F341 Dysthymic disorder: Secondary | ICD-10-CM | POA: Diagnosis not present

## 2015-12-11 DIAGNOSIS — F418 Other specified anxiety disorders: Secondary | ICD-10-CM | POA: Diagnosis not present

## 2015-12-16 DIAGNOSIS — F418 Other specified anxiety disorders: Secondary | ICD-10-CM | POA: Diagnosis not present

## 2015-12-16 DIAGNOSIS — G4709 Other insomnia: Secondary | ICD-10-CM | POA: Diagnosis not present

## 2015-12-16 DIAGNOSIS — M545 Low back pain: Secondary | ICD-10-CM | POA: Diagnosis not present

## 2015-12-16 DIAGNOSIS — G43909 Migraine, unspecified, not intractable, without status migrainosus: Secondary | ICD-10-CM | POA: Diagnosis not present

## 2015-12-16 DIAGNOSIS — F341 Dysthymic disorder: Secondary | ICD-10-CM | POA: Diagnosis not present

## 2015-12-18 DIAGNOSIS — F418 Other specified anxiety disorders: Secondary | ICD-10-CM | POA: Diagnosis not present

## 2015-12-18 DIAGNOSIS — M545 Low back pain: Secondary | ICD-10-CM | POA: Diagnosis not present

## 2015-12-18 DIAGNOSIS — F341 Dysthymic disorder: Secondary | ICD-10-CM | POA: Diagnosis not present

## 2015-12-18 DIAGNOSIS — G4709 Other insomnia: Secondary | ICD-10-CM | POA: Diagnosis not present

## 2015-12-18 DIAGNOSIS — G43909 Migraine, unspecified, not intractable, without status migrainosus: Secondary | ICD-10-CM | POA: Diagnosis not present

## 2015-12-22 DIAGNOSIS — G4709 Other insomnia: Secondary | ICD-10-CM | POA: Diagnosis not present

## 2015-12-22 DIAGNOSIS — F418 Other specified anxiety disorders: Secondary | ICD-10-CM | POA: Diagnosis not present

## 2015-12-22 DIAGNOSIS — G43909 Migraine, unspecified, not intractable, without status migrainosus: Secondary | ICD-10-CM | POA: Diagnosis not present

## 2015-12-22 DIAGNOSIS — M545 Low back pain: Secondary | ICD-10-CM | POA: Diagnosis not present

## 2015-12-25 DIAGNOSIS — F418 Other specified anxiety disorders: Secondary | ICD-10-CM | POA: Diagnosis not present

## 2015-12-25 DIAGNOSIS — M545 Low back pain: Secondary | ICD-10-CM | POA: Diagnosis not present

## 2015-12-25 DIAGNOSIS — G43909 Migraine, unspecified, not intractable, without status migrainosus: Secondary | ICD-10-CM | POA: Diagnosis not present

## 2015-12-25 DIAGNOSIS — F341 Dysthymic disorder: Secondary | ICD-10-CM | POA: Diagnosis not present

## 2015-12-25 DIAGNOSIS — G4709 Other insomnia: Secondary | ICD-10-CM | POA: Diagnosis not present

## 2015-12-26 ENCOUNTER — Ambulatory Visit
Admission: RE | Admit: 2015-12-26 | Discharge: 2015-12-26 | Disposition: A | Discharge status: Home / Self Care | Payer: Federal, State, Local not specified - PPO | Source: Ambulatory Visit | Attending: Family Medicine | Admitting: Family Medicine

## 2015-12-26 ENCOUNTER — Other Ambulatory Visit: Payer: Self-pay | Admitting: Family Medicine

## 2015-12-26 DIAGNOSIS — M549 Dorsalgia, unspecified: Secondary | ICD-10-CM

## 2015-12-26 DIAGNOSIS — R079 Chest pain, unspecified: Secondary | ICD-10-CM | POA: Diagnosis not present

## 2015-12-26 DIAGNOSIS — R0789 Other chest pain: Secondary | ICD-10-CM | POA: Diagnosis not present

## 2015-12-26 DIAGNOSIS — M542 Cervicalgia: Secondary | ICD-10-CM | POA: Diagnosis not present

## 2015-12-29 DIAGNOSIS — G43909 Migraine, unspecified, not intractable, without status migrainosus: Secondary | ICD-10-CM | POA: Diagnosis not present

## 2015-12-29 DIAGNOSIS — M545 Low back pain: Secondary | ICD-10-CM | POA: Diagnosis not present

## 2015-12-29 DIAGNOSIS — F418 Other specified anxiety disorders: Secondary | ICD-10-CM | POA: Diagnosis not present

## 2015-12-29 DIAGNOSIS — G4709 Other insomnia: Secondary | ICD-10-CM | POA: Diagnosis not present

## 2016-01-01 DIAGNOSIS — F418 Other specified anxiety disorders: Secondary | ICD-10-CM | POA: Diagnosis not present

## 2016-01-01 DIAGNOSIS — F341 Dysthymic disorder: Secondary | ICD-10-CM | POA: Diagnosis not present

## 2016-01-01 DIAGNOSIS — M545 Low back pain: Secondary | ICD-10-CM | POA: Diagnosis not present

## 2016-01-01 DIAGNOSIS — G43909 Migraine, unspecified, not intractable, without status migrainosus: Secondary | ICD-10-CM | POA: Diagnosis not present

## 2016-01-01 DIAGNOSIS — G4709 Other insomnia: Secondary | ICD-10-CM | POA: Diagnosis not present

## 2016-01-05 DIAGNOSIS — M549 Dorsalgia, unspecified: Secondary | ICD-10-CM | POA: Diagnosis not present

## 2016-01-05 DIAGNOSIS — R293 Abnormal posture: Secondary | ICD-10-CM | POA: Diagnosis not present

## 2016-01-05 DIAGNOSIS — M546 Pain in thoracic spine: Secondary | ICD-10-CM | POA: Diagnosis not present

## 2016-01-05 DIAGNOSIS — M542 Cervicalgia: Secondary | ICD-10-CM | POA: Diagnosis not present

## 2016-01-06 DIAGNOSIS — F341 Dysthymic disorder: Secondary | ICD-10-CM | POA: Diagnosis not present

## 2016-01-13 DIAGNOSIS — F314 Bipolar disorder, current episode depressed, severe, without psychotic features: Secondary | ICD-10-CM | POA: Diagnosis not present

## 2016-01-13 DIAGNOSIS — R293 Abnormal posture: Secondary | ICD-10-CM | POA: Diagnosis not present

## 2016-01-13 DIAGNOSIS — M542 Cervicalgia: Secondary | ICD-10-CM | POA: Diagnosis not present

## 2016-01-13 DIAGNOSIS — M549 Dorsalgia, unspecified: Secondary | ICD-10-CM | POA: Diagnosis not present

## 2016-01-13 DIAGNOSIS — M546 Pain in thoracic spine: Secondary | ICD-10-CM | POA: Diagnosis not present

## 2016-01-15 DIAGNOSIS — M546 Pain in thoracic spine: Secondary | ICD-10-CM | POA: Diagnosis not present

## 2016-01-15 DIAGNOSIS — R293 Abnormal posture: Secondary | ICD-10-CM | POA: Diagnosis not present

## 2016-01-15 DIAGNOSIS — F900 Attention-deficit hyperactivity disorder, predominantly inattentive type: Secondary | ICD-10-CM | POA: Diagnosis not present

## 2016-01-15 DIAGNOSIS — M542 Cervicalgia: Secondary | ICD-10-CM | POA: Diagnosis not present

## 2016-01-15 DIAGNOSIS — G4700 Insomnia, unspecified: Secondary | ICD-10-CM | POA: Diagnosis not present

## 2016-01-15 DIAGNOSIS — F332 Major depressive disorder, recurrent severe without psychotic features: Secondary | ICD-10-CM | POA: Diagnosis not present

## 2016-01-15 DIAGNOSIS — M549 Dorsalgia, unspecified: Secondary | ICD-10-CM | POA: Diagnosis not present

## 2016-01-15 DIAGNOSIS — F411 Generalized anxiety disorder: Secondary | ICD-10-CM | POA: Diagnosis not present

## 2016-01-22 ENCOUNTER — Ambulatory Visit: Payer: Federal, State, Local not specified - PPO | Admitting: Pulmonary Disease

## 2016-01-22 DIAGNOSIS — M542 Cervicalgia: Secondary | ICD-10-CM | POA: Diagnosis not present

## 2016-01-22 DIAGNOSIS — M549 Dorsalgia, unspecified: Secondary | ICD-10-CM | POA: Diagnosis not present

## 2016-01-22 DIAGNOSIS — R293 Abnormal posture: Secondary | ICD-10-CM | POA: Diagnosis not present

## 2016-01-22 DIAGNOSIS — M546 Pain in thoracic spine: Secondary | ICD-10-CM | POA: Diagnosis not present

## 2016-01-26 DIAGNOSIS — F341 Dysthymic disorder: Secondary | ICD-10-CM | POA: Diagnosis not present

## 2016-01-27 DIAGNOSIS — M546 Pain in thoracic spine: Secondary | ICD-10-CM | POA: Diagnosis not present

## 2016-01-27 DIAGNOSIS — R293 Abnormal posture: Secondary | ICD-10-CM | POA: Diagnosis not present

## 2016-01-27 DIAGNOSIS — M549 Dorsalgia, unspecified: Secondary | ICD-10-CM | POA: Diagnosis not present

## 2016-01-27 DIAGNOSIS — M542 Cervicalgia: Secondary | ICD-10-CM | POA: Diagnosis not present

## 2016-02-05 DIAGNOSIS — K080 Exfoliation of teeth due to systemic causes: Secondary | ICD-10-CM | POA: Diagnosis not present

## 2016-02-09 DIAGNOSIS — F341 Dysthymic disorder: Secondary | ICD-10-CM | POA: Diagnosis not present

## 2016-02-16 DIAGNOSIS — K6289 Other specified diseases of anus and rectum: Secondary | ICD-10-CM | POA: Diagnosis not present

## 2016-02-16 DIAGNOSIS — K625 Hemorrhage of anus and rectum: Secondary | ICD-10-CM | POA: Diagnosis not present

## 2016-02-16 DIAGNOSIS — K600 Acute anal fissure: Secondary | ICD-10-CM | POA: Diagnosis not present

## 2016-02-17 DIAGNOSIS — M549 Dorsalgia, unspecified: Secondary | ICD-10-CM | POA: Diagnosis not present

## 2016-02-17 DIAGNOSIS — M542 Cervicalgia: Secondary | ICD-10-CM | POA: Diagnosis not present

## 2016-02-17 DIAGNOSIS — R293 Abnormal posture: Secondary | ICD-10-CM | POA: Diagnosis not present

## 2016-02-17 DIAGNOSIS — M546 Pain in thoracic spine: Secondary | ICD-10-CM | POA: Diagnosis not present

## 2016-02-19 DIAGNOSIS — G43019 Migraine without aura, intractable, without status migrainosus: Secondary | ICD-10-CM | POA: Diagnosis not present

## 2016-02-19 DIAGNOSIS — G43719 Chronic migraine without aura, intractable, without status migrainosus: Secondary | ICD-10-CM | POA: Diagnosis not present

## 2016-02-20 DIAGNOSIS — R293 Abnormal posture: Secondary | ICD-10-CM | POA: Diagnosis not present

## 2016-02-20 DIAGNOSIS — M542 Cervicalgia: Secondary | ICD-10-CM | POA: Diagnosis not present

## 2016-02-20 DIAGNOSIS — M549 Dorsalgia, unspecified: Secondary | ICD-10-CM | POA: Diagnosis not present

## 2016-02-20 DIAGNOSIS — M546 Pain in thoracic spine: Secondary | ICD-10-CM | POA: Diagnosis not present

## 2016-03-11 DIAGNOSIS — F341 Dysthymic disorder: Secondary | ICD-10-CM | POA: Diagnosis not present

## 2016-03-29 DIAGNOSIS — K600 Acute anal fissure: Secondary | ICD-10-CM | POA: Diagnosis not present

## 2016-03-29 DIAGNOSIS — K625 Hemorrhage of anus and rectum: Secondary | ICD-10-CM | POA: Diagnosis not present

## 2016-03-29 DIAGNOSIS — K6289 Other specified diseases of anus and rectum: Secondary | ICD-10-CM | POA: Diagnosis not present

## 2016-03-30 DIAGNOSIS — F341 Dysthymic disorder: Secondary | ICD-10-CM | POA: Diagnosis not present

## 2016-04-06 DIAGNOSIS — F341 Dysthymic disorder: Secondary | ICD-10-CM | POA: Diagnosis not present

## 2016-04-08 DIAGNOSIS — F900 Attention-deficit hyperactivity disorder, predominantly inattentive type: Secondary | ICD-10-CM | POA: Diagnosis not present

## 2016-04-08 DIAGNOSIS — F332 Major depressive disorder, recurrent severe without psychotic features: Secondary | ICD-10-CM | POA: Diagnosis not present

## 2016-04-08 DIAGNOSIS — F411 Generalized anxiety disorder: Secondary | ICD-10-CM | POA: Diagnosis not present

## 2016-04-08 DIAGNOSIS — F4323 Adjustment disorder with mixed anxiety and depressed mood: Secondary | ICD-10-CM | POA: Diagnosis not present

## 2016-04-12 DIAGNOSIS — F341 Dysthymic disorder: Secondary | ICD-10-CM | POA: Diagnosis not present

## 2016-04-19 DIAGNOSIS — F341 Dysthymic disorder: Secondary | ICD-10-CM | POA: Diagnosis not present

## 2016-04-29 DIAGNOSIS — F341 Dysthymic disorder: Secondary | ICD-10-CM | POA: Diagnosis not present

## 2016-05-05 DIAGNOSIS — F332 Major depressive disorder, recurrent severe without psychotic features: Secondary | ICD-10-CM | POA: Diagnosis not present

## 2016-05-05 DIAGNOSIS — G4700 Insomnia, unspecified: Secondary | ICD-10-CM | POA: Diagnosis not present

## 2016-05-05 DIAGNOSIS — F411 Generalized anxiety disorder: Secondary | ICD-10-CM | POA: Diagnosis not present

## 2016-05-05 DIAGNOSIS — F900 Attention-deficit hyperactivity disorder, predominantly inattentive type: Secondary | ICD-10-CM | POA: Diagnosis not present

## 2016-05-06 DIAGNOSIS — F341 Dysthymic disorder: Secondary | ICD-10-CM | POA: Diagnosis not present

## 2016-05-11 DIAGNOSIS — F341 Dysthymic disorder: Secondary | ICD-10-CM | POA: Diagnosis not present

## 2016-05-20 DIAGNOSIS — F341 Dysthymic disorder: Secondary | ICD-10-CM | POA: Diagnosis not present

## 2016-05-27 DIAGNOSIS — F341 Dysthymic disorder: Secondary | ICD-10-CM | POA: Diagnosis not present

## 2016-06-03 DIAGNOSIS — F341 Dysthymic disorder: Secondary | ICD-10-CM | POA: Diagnosis not present

## 2016-06-09 DIAGNOSIS — F900 Attention-deficit hyperactivity disorder, predominantly inattentive type: Secondary | ICD-10-CM | POA: Diagnosis not present

## 2016-06-09 DIAGNOSIS — F411 Generalized anxiety disorder: Secondary | ICD-10-CM | POA: Diagnosis not present

## 2016-06-09 DIAGNOSIS — F332 Major depressive disorder, recurrent severe without psychotic features: Secondary | ICD-10-CM | POA: Diagnosis not present

## 2016-06-09 DIAGNOSIS — G4700 Insomnia, unspecified: Secondary | ICD-10-CM | POA: Diagnosis not present

## 2016-06-17 DIAGNOSIS — F341 Dysthymic disorder: Secondary | ICD-10-CM | POA: Diagnosis not present

## 2016-06-30 DIAGNOSIS — M25511 Pain in right shoulder: Secondary | ICD-10-CM | POA: Diagnosis not present

## 2016-07-01 DIAGNOSIS — F341 Dysthymic disorder: Secondary | ICD-10-CM | POA: Diagnosis not present

## 2016-07-08 DIAGNOSIS — F341 Dysthymic disorder: Secondary | ICD-10-CM | POA: Diagnosis not present

## 2016-07-22 DIAGNOSIS — F341 Dysthymic disorder: Secondary | ICD-10-CM | POA: Diagnosis not present

## 2016-07-29 DIAGNOSIS — J301 Allergic rhinitis due to pollen: Secondary | ICD-10-CM | POA: Diagnosis not present

## 2016-07-29 DIAGNOSIS — J4520 Mild intermittent asthma, uncomplicated: Secondary | ICD-10-CM | POA: Diagnosis not present

## 2016-07-29 DIAGNOSIS — D802 Selective deficiency of immunoglobulin A [IgA]: Secondary | ICD-10-CM | POA: Diagnosis not present

## 2016-07-29 DIAGNOSIS — J3089 Other allergic rhinitis: Secondary | ICD-10-CM | POA: Diagnosis not present

## 2016-08-06 DIAGNOSIS — G43719 Chronic migraine without aura, intractable, without status migrainosus: Secondary | ICD-10-CM | POA: Diagnosis not present

## 2016-08-06 DIAGNOSIS — G43019 Migraine without aura, intractable, without status migrainosus: Secondary | ICD-10-CM | POA: Diagnosis not present

## 2016-08-12 DIAGNOSIS — F341 Dysthymic disorder: Secondary | ICD-10-CM | POA: Diagnosis not present

## 2016-08-19 DIAGNOSIS — F411 Generalized anxiety disorder: Secondary | ICD-10-CM | POA: Diagnosis not present

## 2016-08-19 DIAGNOSIS — F332 Major depressive disorder, recurrent severe without psychotic features: Secondary | ICD-10-CM | POA: Diagnosis not present

## 2016-08-19 DIAGNOSIS — F900 Attention-deficit hyperactivity disorder, predominantly inattentive type: Secondary | ICD-10-CM | POA: Diagnosis not present

## 2016-08-19 DIAGNOSIS — G4700 Insomnia, unspecified: Secondary | ICD-10-CM | POA: Diagnosis not present

## 2016-08-26 DIAGNOSIS — F341 Dysthymic disorder: Secondary | ICD-10-CM | POA: Diagnosis not present

## 2016-09-06 DIAGNOSIS — J029 Acute pharyngitis, unspecified: Secondary | ICD-10-CM | POA: Diagnosis not present

## 2016-09-16 DIAGNOSIS — F341 Dysthymic disorder: Secondary | ICD-10-CM | POA: Diagnosis not present

## 2016-09-22 DIAGNOSIS — L237 Allergic contact dermatitis due to plants, except food: Secondary | ICD-10-CM | POA: Diagnosis not present

## 2016-10-20 DIAGNOSIS — F341 Dysthymic disorder: Secondary | ICD-10-CM | POA: Diagnosis not present

## 2016-10-21 DIAGNOSIS — F341 Dysthymic disorder: Secondary | ICD-10-CM | POA: Diagnosis not present

## 2016-12-12 DIAGNOSIS — R1110 Vomiting, unspecified: Secondary | ICD-10-CM | POA: Diagnosis not present

## 2016-12-12 DIAGNOSIS — R509 Fever, unspecified: Secondary | ICD-10-CM | POA: Diagnosis not present

## 2016-12-29 DIAGNOSIS — F900 Attention-deficit hyperactivity disorder, predominantly inattentive type: Secondary | ICD-10-CM | POA: Diagnosis not present

## 2016-12-29 DIAGNOSIS — G4700 Insomnia, unspecified: Secondary | ICD-10-CM | POA: Diagnosis not present

## 2016-12-29 DIAGNOSIS — F411 Generalized anxiety disorder: Secondary | ICD-10-CM | POA: Diagnosis not present

## 2016-12-29 DIAGNOSIS — F332 Major depressive disorder, recurrent severe without psychotic features: Secondary | ICD-10-CM | POA: Diagnosis not present

## 2016-12-30 DIAGNOSIS — J069 Acute upper respiratory infection, unspecified: Secondary | ICD-10-CM | POA: Diagnosis not present

## 2017-02-22 DIAGNOSIS — G43719 Chronic migraine without aura, intractable, without status migrainosus: Secondary | ICD-10-CM | POA: Diagnosis not present

## 2017-02-22 DIAGNOSIS — G43019 Migraine without aura, intractable, without status migrainosus: Secondary | ICD-10-CM | POA: Diagnosis not present

## 2017-02-24 DIAGNOSIS — K080 Exfoliation of teeth due to systemic causes: Secondary | ICD-10-CM | POA: Diagnosis not present

## 2017-05-04 DIAGNOSIS — J101 Influenza due to other identified influenza virus with other respiratory manifestations: Secondary | ICD-10-CM | POA: Diagnosis not present

## 2017-05-04 DIAGNOSIS — R52 Pain, unspecified: Secondary | ICD-10-CM | POA: Diagnosis not present

## 2017-06-08 DIAGNOSIS — F900 Attention-deficit hyperactivity disorder, predominantly inattentive type: Secondary | ICD-10-CM | POA: Diagnosis not present

## 2017-06-08 DIAGNOSIS — F411 Generalized anxiety disorder: Secondary | ICD-10-CM | POA: Diagnosis not present

## 2017-06-08 DIAGNOSIS — G4700 Insomnia, unspecified: Secondary | ICD-10-CM | POA: Diagnosis not present

## 2017-06-08 DIAGNOSIS — Z559 Problems related to education and literacy, unspecified: Secondary | ICD-10-CM | POA: Diagnosis not present

## 2017-07-20 DIAGNOSIS — J3089 Other allergic rhinitis: Secondary | ICD-10-CM | POA: Diagnosis not present

## 2017-07-20 DIAGNOSIS — J4520 Mild intermittent asthma, uncomplicated: Secondary | ICD-10-CM | POA: Diagnosis not present

## 2017-07-20 DIAGNOSIS — D802 Selective deficiency of immunoglobulin A [IgA]: Secondary | ICD-10-CM | POA: Diagnosis not present

## 2017-07-20 DIAGNOSIS — J301 Allergic rhinitis due to pollen: Secondary | ICD-10-CM | POA: Diagnosis not present

## 2017-08-02 NOTE — Progress Notes (Signed)
[facility] [address] [place], [place] [address] Phone: [phone] Subjective:    I'm seeing this patient by the request  of:  Tally Joe, MD   CC: back and neck pain   BJY:[id number]  Jeffery Johnson is a 22 y.o. male coming in with complaint of back and neck pain. Worse on the left side. Started about 6 months ago. Sharp pain. Decreased ROM. Sitting makes it worse. Has used Icy hot patches. Neck cracks. Numbness and tingling in left arm.  Patient states that this comes and goes.  Patient states that there is tightness of the lower back.  Denies any radiation of the legs.  Patient does not work out on a regular basis.  Is in school.  Feels that sometimes a standing seems to make it worse.  Sometimes gets worse with activity or with stress.     Past Medical History:  Diagnosis Date  . ADHD (attention deficit hyperactivity disorder)   . Anxiety   . Depression   . Excessive daytime sleepiness 07/05/2014  . Insomnia due to mental condition 07/05/2014  . Migraine   . Seasonal allergies    No past surgical history on file. Social History   Socioeconomic History  . Marital status: Single    Spouse name: Not on file  . Number of children: Not on file  . Years of education: Not on file  . Highest education level: Not on file  Occupational History  . Not on file  Social Needs  . Financial resource strain: Not on file  . Food insecurity:    Worry: Not on file    Inability: Not on file  . Transportation needs:    Medical: Not on file    Non-medical: Not on file  Tobacco Use  . Smoking status: Never Smoker  . Smokeless tobacco: Never Used  Substance and Sexual Activity  . Alcohol use: No    Alcohol/week: 0.0 oz    Comment: 1 drink a week  . Drug use: No  . Sexual activity: Not on file  Lifestyle  . Physical activity:    Days per week: Not on file    Minutes per session: Not on file  . Stress: Not on file  Relationships  . Social  connections:    Talks on phone: Not on file    Gets together: Not on file    Attends religious service: Not on file    Active member of club or organization: Not on file    Attends meetings of clubs or organizations: Not on file    Relationship status: Not on file  Other Topics Concern  . Not on file  Social History Narrative   2 drinks of caffeine a week.   No Known Allergies Family History  Problem Relation Age of Onset  . Breast cancer Mother   . Skin cancer Mother   . Stroke Maternal Grandfather   . Heart disease Maternal Grandfather   . Diabetes Maternal Grandfather   . Stroke Paternal Grandfather   . Heart disease Paternal Grandfather      Past medical history, social, surgical and family history all reviewed in electronic medical record.  No pertanent information unless stated regarding to the chief complaint.   Review of Systems:Review of systems updated and as accurate as of 08/03/17  No visual changes, nausea, vomiting, diarrhea, constipation, dizziness, abdominal pain, skin rash, fevers, chills, night sweats, weight loss, swollen lymph nodes, body aches, joint swelling,  chest pain, shortness of breath, mood changes.  Positive headaches, muscle aches  Objective  Blood pressure 110/70, pulse 81, height  (1.778 m), weight 171 lb (77.6 kg), SpO2 98 %. Systems examined below as of 08/03/17   General: No apparent distress alert and oriented x3 mood and affect normal, dressed appropriately.  HEENT: Pupils equal, extraocular movements intact  Respiratory: Patient's speak in full sentences and does not appear short of breath  Cardiovascular: No lower extremity edema, non tender, no erythema  Skin: Warm dry intact with no signs of infection or rash on extremities or on axial skeleton.  Abdomen: Soft nontender  Neuro: Cranial nerves II through XII are intact, neurovascularly intact in all extremities with 2+ DTRs and 2+ pulses.  Lymph: No lymphadenopathy of posterior  or anterior cervical chain or axillae bilaterally.  Gait normal with good balance and coordination.  MSK:  Non tender with full range of motion and good stability and symmetric strength and tone of shoulders, elbows, wrist, hip, knee and ankles bilaterally.  Neck: Inspection unremarkable. No palpable stepoffs. Negative Spurling's maneuver. Full neck range of motion Grip strength and sensation normal in bilateral hands Strength good C4 to T1 distribution No sensory change to C4 to T1 Negative Hoffman sign bilaterally Reflexes normal Retraction of the shoulders are noted with poor posture.  Tightness of the parascapular muscles bilaterally  Lower back exam shows some tightness of the lumbar spine with tight hip flexors but otherwise full range of motion.  Negative Faber and straight leg test.  Mild discomfort over the sacroiliac joints bilaterally  Osteopathic findings Cervical C2 flexed rotated and side bent right C4 flexed rotated and side bent left C6 flexed rotated and side bent left T3 extended rotated and side bent right inhaled third rib T9 extended rotated and side bent left L2 flexed rotated and side bent right Sacrum right on right   97110; 15 additional minutes spent for Therapeutic exercises as stated in above notes.  This included exercises focusing on stretching, strengthening, with significant focus on eccentric aspects.   Long term goals include an improvement in range of motion, strength, endurance as well as avoiding reinjury. Patient's frequency would include in 1-2 times a day, 3-5 times a week for a duration of 6-12 weeks. Low back exercises that included:  Pelvic tilt/bracing instruction to focus on control of the pelvic girdle and lower abdominal muscles  Glute strengthening exercises, focusing on proper firing of the glutes without engaging the low back muscles Proper stretching techniques for maximum relief for the hamstrings, hip flexors, low back and some  rotation where tolerated   Proper technique shown and discussed handout in great detail with ATC.  All questions were discussed and answered.      Impression and Recommendations:     This case required medical decision making of moderate complexity.      Note: This dictation was prepared with Dragon dictation along with smaller phrase technology. Any transcriptional errors that result from this process are unintentional.

## 2017-08-03 ENCOUNTER — Other Ambulatory Visit: Payer: Self-pay

## 2017-08-03 ENCOUNTER — Ambulatory Visit: Payer: Federal, State, Local not specified - PPO | Admitting: Family Medicine

## 2017-08-03 ENCOUNTER — Encounter: Payer: Self-pay | Admitting: Family Medicine

## 2017-08-03 DIAGNOSIS — R293 Abnormal posture: Secondary | ICD-10-CM

## 2017-08-03 DIAGNOSIS — M533 Sacrococcygeal disorders, not elsewhere classified: Secondary | ICD-10-CM

## 2017-08-03 DIAGNOSIS — M999 Biomechanical lesion, unspecified: Secondary | ICD-10-CM | POA: Insufficient documentation

## 2017-08-03 MED ORDER — DICLOFENAC SODIUM 2 % TD SOLN: 2.0000 g | Freq: Two times a day (BID) | TRANSDERMAL | 3 refills | Status: DC

## 2017-08-03 NOTE — Patient Instructions (Signed)
Good to see you  Ice Is your friend. Ice 20 minutes 2 times daily. Usually after activity and before bed. pennsaid pinkie amount topically 2 times daily as needed.  Over the counter get  Vitamin D 2000 IU daily  Turmeric  2 times daily  Keep monitor at eye level.  Exercises 3 times a week.  See me again I n4 weeks

## 2017-08-03 NOTE — Assessment & Plan Note (Signed)
[facility]  Begin with easy walking, heel, toe and backwards  Calf raises on a step First lower and then raise on 1 foot If this is painful lower on 1 foot but do the heel raise on both feet  Begin with 3 sets of 10 repetitions  Increase by 5 repetitions every 3 days  Goal is 3 sets of 30 repetitions  Do with both knee straight and knee at 20 degrees of flexion  If pain persists at 3 sets of 30 - add backpack with 5 lbs Increase by 5 lbs per week to max of 30 lbs  

## 2017-08-03 NOTE — Assessment & Plan Note (Signed)
Poor posture.  I do think that it is ergonomics.  We discussed icing regimen, posture and ergonomics that could be beneficial.  Responded well to manipulation today.  Patient learn home exercises from athletic trainer.  Following up again in 4 weeks

## 2017-08-03 NOTE — Assessment & Plan Note (Signed)
Decision today to treat with OMT was based on Physical Exam  After verbal consent patient was treated with HVLA, ME, FPR techniques in cervical, thoracic, lumbar and sacral areas  Patient tolerated the procedure well with improvement in symptoms  Patient given exercises, stretches and lifestyle modifications  See medications in patient instructions if given  Patient will follow up in 4 weeks 

## 2017-08-16 DIAGNOSIS — G43719 Chronic migraine without aura, intractable, without status migrainosus: Secondary | ICD-10-CM | POA: Diagnosis not present

## 2017-08-16 DIAGNOSIS — G43019 Migraine without aura, intractable, without status migrainosus: Secondary | ICD-10-CM | POA: Diagnosis not present

## 2017-09-03 NOTE — Progress Notes (Deleted)
[facility] [address] [place], [place] [address] Phone: [phone] Subjective:    I'm seeing this patient by the request  of:    CC:   BJY:NWGNFAOZHY  Jeffery Johnson is a 22 y.o. male coming in with complaint of ***  Onset-  Location Duration-  Character- Aggravating factors- Reliving factors-  Therapies tried-  Severity-     Past Medical History:  Diagnosis Date  . ADHD (attention deficit hyperactivity disorder)   . Anxiety   . Depression   . Excessive daytime sleepiness 07/05/2014  . Insomnia due to mental condition 07/05/2014  . Migraine   . Seasonal allergies    No past surgical history on file. Social History   Socioeconomic History  . Marital status: Single    Spouse name: Not on file  . Number of children: Not on file  . Years of education: Not on file  . Highest education level: Not on file  Occupational History  . Not on file  Social Needs  . Financial resource strain: Not on file  . Food insecurity:    Worry: Not on file    Inability: Not on file  . Transportation needs:    Medical: Not on file    Non-medical: Not on file  Tobacco Use  . Smoking status: Never Smoker  . Smokeless tobacco: Never Used  Substance and Sexual Activity  . Alcohol use: No    Alcohol/week: 0.0 oz    Comment: 1 drink a week  . Drug use: No  . Sexual activity: Not on file  Lifestyle  . Physical activity:    Days per week: Not on file    Minutes per session: Not on file  . Stress: Not on file  Relationships  . Social connections:    Talks on phone: Not on file    Gets together: Not on file    Attends religious service: Not on file    Active member of club or organization: Not on file    Attends meetings of clubs or organizations: Not on file    Relationship status: Not on file  Other Topics Concern  . Not on file  Social History Narrative   2 drinks of caffeine a week.   No Known Allergies Family History  Problem Relation Age  of Onset  . Breast cancer Mother   . Skin cancer Mother   . Stroke Maternal Grandfather   . Heart disease Maternal Grandfather   . Diabetes Maternal Grandfather   . Stroke Paternal Grandfather   . Heart disease Paternal Grandfather      Past medical history, social, surgical and family history all reviewed in electronic medical record.  No pertanent information unless stated regarding to the chief complaint.   Review of Systems:Review of systems updated and as accurate as of 09/03/17  No headache, visual changes, nausea, vomiting, diarrhea, constipation, dizziness, abdominal pain, skin rash, fevers, chills, night sweats, weight loss, swollen lymph nodes, body aches, joint swelling, muscle aches, chest pain, shortness of breath, mood changes.   Objective  There were no vitals taken for this visit. Systems examined below as of 09/03/17   General: No apparent distress alert and oriented x3 mood and affect normal, dressed appropriately.  HEENT: Pupils equal, extraocular movements intact  Respiratory: Patient's speak in full sentences and does not appear short of breath  Cardiovascular: No lower extremity edema, non tender, no erythema  Skin: Warm dry intact with no signs of infection or  rash on extremities or on axial skeleton.  Abdomen: Soft nontender  Neuro: Cranial nerves II through XII are intact, neurovascularly intact in all extremities with 2+ DTRs and 2+ pulses.  Lymph: No lymphadenopathy of posterior or anterior cervical chain or axillae bilaterally.  Gait normal with good balance and coordination.  MSK:  Non tender with full range of motion and good stability and symmetric strength and tone of shoulders, elbows, wrist, hip, knee and ankles bilaterally.     Impression and Recommendations:     This case required medical decision making of moderate complexity.      Note: This dictation was prepared with Dragon dictation along with smaller phrase technology. Any  transcriptional errors that result from this process are unintentional.

## 2017-09-05 ENCOUNTER — Ambulatory Visit: Payer: Federal, State, Local not specified - PPO | Admitting: Family Medicine

## 2017-09-05 DIAGNOSIS — Z0289 Encounter for other administrative examinations: Secondary | ICD-10-CM

## 2017-09-06 DIAGNOSIS — F900 Attention-deficit hyperactivity disorder, predominantly inattentive type: Secondary | ICD-10-CM | POA: Diagnosis not present

## 2017-09-06 DIAGNOSIS — F3341 Major depressive disorder, recurrent, in partial remission: Secondary | ICD-10-CM | POA: Diagnosis not present

## 2017-09-06 DIAGNOSIS — G4700 Insomnia, unspecified: Secondary | ICD-10-CM | POA: Diagnosis not present

## 2017-09-06 DIAGNOSIS — F411 Generalized anxiety disorder: Secondary | ICD-10-CM | POA: Diagnosis not present

## 2017-09-20 DIAGNOSIS — F341 Dysthymic disorder: Secondary | ICD-10-CM | POA: Diagnosis not present

## 2017-09-26 DIAGNOSIS — K080 Exfoliation of teeth due to systemic causes: Secondary | ICD-10-CM | POA: Diagnosis not present

## 2017-11-21 DIAGNOSIS — L218 Other seborrheic dermatitis: Secondary | ICD-10-CM | POA: Diagnosis not present

## 2017-12-06 DIAGNOSIS — G4700 Insomnia, unspecified: Secondary | ICD-10-CM | POA: Diagnosis not present

## 2017-12-06 DIAGNOSIS — F411 Generalized anxiety disorder: Secondary | ICD-10-CM | POA: Diagnosis not present

## 2017-12-06 DIAGNOSIS — F3341 Major depressive disorder, recurrent, in partial remission: Secondary | ICD-10-CM | POA: Diagnosis not present

## 2017-12-06 DIAGNOSIS — F900 Attention-deficit hyperactivity disorder, predominantly inattentive type: Secondary | ICD-10-CM | POA: Diagnosis not present

## 2018-01-20 ENCOUNTER — Other Ambulatory Visit: Payer: Self-pay

## 2018-01-20 ENCOUNTER — Emergency Department (HOSPITAL_COMMUNITY)
Admission: EM | Admit: 2018-01-20 | Discharge: 2018-01-20 | Disposition: A | Discharge status: Home / Self Care | Payer: Federal, State, Local not specified - PPO | Attending: Emergency Medicine | Admitting: Emergency Medicine

## 2018-01-20 ENCOUNTER — Emergency Department (HOSPITAL_COMMUNITY): Payer: Federal, State, Local not specified - PPO

## 2018-01-20 DIAGNOSIS — R040 Epistaxis: Secondary | ICD-10-CM | POA: Diagnosis not present

## 2018-01-20 DIAGNOSIS — R55 Syncope and collapse: Secondary | ICD-10-CM | POA: Diagnosis not present

## 2018-01-20 DIAGNOSIS — R258 Other abnormal involuntary movements: Secondary | ICD-10-CM

## 2018-01-20 DIAGNOSIS — R251 Tremor, unspecified: Secondary | ICD-10-CM | POA: Diagnosis not present

## 2018-01-20 DIAGNOSIS — Z79899 Other long term (current) drug therapy: Secondary | ICD-10-CM | POA: Diagnosis not present

## 2018-01-20 DIAGNOSIS — R569 Unspecified convulsions: Secondary | ICD-10-CM | POA: Diagnosis not present

## 2018-01-20 LAB — BASIC METABOLIC PANEL
Anion gap: 10 (ref 5–15)
BUN: 13 mg/dL (ref 6–20)
CALCIUM: 9.2 mg/dL (ref 8.9–10.3)
CHLORIDE: 105 mmol/L (ref 98–111)
CO2: 22 mmol/L (ref 22–32)
CREATININE: 1 mg/dL (ref 0.61–1.24)
GFR calc non Af Amer: >60 mL/min (ref >60)
GLUCOSE: 111 mg/dL — AB (ref 70–99)
Potassium: 3.8 mmol/L (ref 3.5–5.1)
Sodium: 137 mmol/L (ref 135–145)

## 2018-01-20 LAB — CBC WITH DIFFERENTIAL/PLATELET
ABS IMMATURE GRANULOCYTES: 0.03 10*3/uL (ref 0.00–0.07)
BASOS PCT: 1 %
Basophils Absolute: 0 10*3/uL (ref 0.0–0.1)
EOS ABS: 0.3 10*3/uL (ref 0.0–0.5)
Eosinophils Relative: 3 %
HCT: 46.6 % (ref 39.0–52.0)
Hemoglobin: 15.9 g/dL (ref 13.0–17.0)
Immature Granulocytes: 0 %
LYMPHS ABS: 2.9 10*3/uL (ref 0.7–4.0)
LYMPHS PCT: 35 %
MCH: 30.9 pg (ref 26.0–34.0)
MCHC: 34.1 g/dL (ref 30.0–36.0)
MCV: 90.5 fL (ref 80.0–100.0)
MONOS PCT: 9 %
Monocytes Absolute: 0.8 10*3/uL (ref 0.1–1.0)
NRBC: 0 % (ref 0.0–0.2)
Neutro Abs: 4.3 10*3/uL (ref 1.7–7.7)
Neutrophils Relative %: 52 %
Platelets: 307 10*3/uL (ref 150–400)
RBC: 5.15 MIL/uL (ref 4.22–5.81)
RDW: 11.8 % (ref 11.5–15.5)
WBC: 8.3 10*3/uL (ref 4.0–10.5)

## 2018-01-20 LAB — CBG MONITORING, ED: GLUCOSE-CAPILLARY: 100 mg/dL — AB (ref 70–99)

## 2018-01-20 NOTE — ED Triage Notes (Signed)
Patient from home, states had a seizure 2 days ago that was witnessed by fiance while he was under the influence of cannabis and alcohol. Patient can't recall the seizure. EMS was not called and patient did not get seen by medicalstaff. No hx of previous seizures. Patient also c/o twitches that have happened since the seizure, one twitch about every hour.

## 2018-01-20 NOTE — ED Notes (Signed)
Patient verbalizes understanding of discharge instructions. Opportunity for questioning and answers were provided. Armband removed by staff, pt discharged from ED.  

## 2018-01-20 NOTE — ED Provider Notes (Addendum)
[facility] Provider Note   CSN: [id number] Arrival date & time: 01/20/18  2129     History   Chief Complaint Chief Complaint  Patient presents with  . Seizures    HPI [name] is a 22 y.o. male.  22 year old male brought in by fianc for syncopal episode which occurred 2 days ago.  Patient states that he was celebrating, had beer and smoked marijuana.  Patient states he is unable to recall what happened after this, states he has had jerking movements of his body since that time and then today developed a nosebleed out of the right side of his nose which lasted about 15 minutes and stopped when he applied a tampon to his nose for pressure.  Patient's fianc states that patient walked outside 2 nights ago to the car, they had a conversation in the car and came back into the shed they were in.  While patient was walking into the shed fianc noticed his eyes were looking around and then rolled back into his head and patient started to fall.  Patient's fianc was able to assist him to the ground and he did not sustain any injuries from a fall.  Fianc did not witness any shaking at that time states that his eyes were moving but he was not responding for about 5 to 10 minutes.  After that period of time patient was able to stand with assistance and ambulate with assistance.  No loss of water control, did not bite his tongue, no history of prior seizures or syncopal episodes.  Patient takes medications for anxiety and depression, denies any recent changes in his medications and has not suddenly discontinued any medications.  No other complaints or concerns.     Past Medical History:  Diagnosis Date  . ADHD (attention deficit hyperactivity disorder)   . Anxiety   . Depression   . Excessive daytime sleepiness 07/05/2014  . Insomnia due to mental condition 07/05/2014  . Migraine   . Seasonal allergies     Patient Active Problem List   Diagnosis Date  Noted  . Poor posture 08/03/2017  . Nonallopathic lesion of thoracic region 08/03/2017  . Nonallopathic lesion of sacral region 08/03/2017  . Nonallopathic lesion of lumbosacral region 08/03/2017  . SI (sacroiliac) joint dysfunction 08/03/2017  . Hypersomnia with sleep apnea 10/23/2015  . Excessive daytime sleepiness 07/05/2014  . Insomnia due to mental condition 07/05/2014  . Snoring 07/05/2014    No past surgical history on file.      Home Medications    Prior to Admission medications   Medication Sig Start Date End Date Taking? Authorizing Provider  busPIRone (BUSPAR) 30 MG tablet Take 30 mg by mouth every morning.    [provider]  cetirizine (ZYRTEC) 10 MG tablet Take 10 mg by mouth daily.    [provider]  clonazePAM (KLONOPIN) 0.5 MG tablet Take 0.5 mg by mouth 2 (two) times daily as needed for anxiety.    [provider]  Diclofenac Sodium (PENNSAID) 2 % SOLN Place 2 g onto the skin 2 (two) times daily. 08/03/17   Judi SaaSmith, Zachary M, DO  escitalopram (LEXAPRO) 20 MG tablet Take 30 mg by mouth daily.    [provider]  lactobacillus acidophilus (BACID) TABS tablet Take 2 tablets by mouth 3 (three) times daily.    [provider]  lisdexamfetamine (VYVANSE) 50 MG capsule Take 50 mg by mouth daily.    [provider]  Melatonin 1 MG TABS Take 1 tablet by mouth at bedtime as needed (sleep).    [provider]  montelukast (SINGULAIR) 10 MG tablet Take 10 mg by mouth at bedtime.    [provider]  Multiple Vitamin (MULTIVITAMIN) tablet Take 1 tablet by mouth daily.    [provider]  omeprazole (PRILOSEC) 20 MG capsule Take 20 mg by mouth daily.    [provider]  Probiotic Product (ALIGN PO) Take by mouth.    [provider]  ZOLMitriptan (ZOMIG) 2.5 MG tablet Take 2.5 mg by mouth as needed for migraine or headache.    [provider]  zonisamide (ZONEGRAN) 100 MG  capsule Take 300 mg by mouth at bedtime.    [provider]    Family History Family History  Problem Relation Age of Onset  . Breast cancer Mother   . Skin cancer Mother   . Stroke Maternal Grandfather   . Heart disease Maternal Grandfather   . Diabetes Maternal Grandfather   . Stroke Paternal Grandfather   . Heart disease Paternal Grandfather     Social History Social History   Tobacco Use  . Smoking status: Never Smoker  . Smokeless tobacco: Never Used  Substance Use Topics  . Alcohol use: No    Alcohol/week: 0.0 standard drinks    Comment: 1 drink a week  . Drug use: No     Allergies   Patient has no known allergies.   Review of Systems Review of Systems  Constitutional: Negative for chills and fever.  HENT: Positive for nosebleeds.   Eyes: Negative for visual disturbance.  Gastrointestinal: Negative for nausea and vomiting.  Genitourinary: Negative for difficulty urinating.  Skin: Negative for rash and wound.  Allergic/Immunologic: Negative for immunocompromised state.  Neurological: Negative for dizziness, weakness and headaches.  Hematological: Does not bruise/bleed easily.  Psychiatric/Behavioral: Positive for confusion.  All other systems reviewed and are negative.    Physical Exam Updated Vital Signs BP 109/65   Pulse 71   Resp (!) 24   Ht 5\' 9"  (1.753 m)   Wt 81.6 kg   SpO2 95%   BMI 26.58 kg/m   Physical Exam  Constitutional: He is oriented to person, place, and time. He appears well-developed and well-nourished. No distress.  HENT:  Head: Normocephalic and atraumatic.  Nose:    Mouth/Throat: No oropharyngeal exudate.  Eyes: Right conjunctiva is injected. Left conjunctiva is injected.  Cardiovascular: Normal rate, regular rhythm, normal heart sounds and intact distal pulses.  No murmur heard. Pulmonary/Chest: Effort normal and breath sounds normal. No respiratory distress.  Abdominal: Soft. There is no tenderness.    Musculoskeletal: He exhibits no tenderness.  Lymphadenopathy:    He has no cervical adenopathy.  Neurological: He is alert and oriented to person, place, and time. He has normal strength. No cranial nerve deficit or sensory deficit. He exhibits normal muscle tone. He displays no seizure activity. Coordination normal. GCS eye subscore is 4. GCS verbal subscore is 5. GCS motor subscore is 6.  Skin: Skin is warm and dry. He is not diaphoretic.  Psychiatric: He has a normal mood and affect. His behavior is normal.  Nursing note and vitals reviewed.    ED Treatments / Results  Labs (all labs ordered are listed, but only abnormal results are displayed) Labs Reviewed  BASIC METABOLIC PANEL - Abnormal; Notable for the following components:      Result Value   Glucose, Bld 111 (*)  All other components within normal limits  CBG MONITORING, ED - Abnormal; Notable for the following components:   Glucose-Capillary 100 (*)    All other components within normal limits  CBC WITH DIFFERENTIAL/PLATELET  RAPID URINE DRUG SCREEN, HOSP PERFORMED    EKG None  Radiology Ct Head Wo Contrast  Result Date: 01/20/2018 CLINICAL DATA:  Seizure 2 days ago. EXAM: CT HEAD WITHOUT CONTRAST TECHNIQUE: Contiguous axial images were obtained from the base of the skull through the vertex without intravenous contrast. COMPARISON:  None. FINDINGS: Brain: No evidence of acute infarction, hemorrhage, hydrocephalus, extra-axial collection or mass lesion/mass effect. Vascular: No hyperdense vessel or unexpected calcification. Skull: No osseous abnormality. Sinuses/Orbits: Visualized paranasal sinuses are clear. Visualized mastoid sinuses are clear. Visualized orbits demonstrate no focal abnormality. Other: None IMPRESSION: No acute intracranial pathology. Electronically Signed   By: Elige Ko   On: 01/20/2018 22:30    Procedures Procedures (including critical care time)  Medications Ordered in ED Medications - No  data to display   Initial Impression / Assessment and Plan / ED Course  I have reviewed the triage vital signs and the nursing notes.  Pertinent labs & imaging results that were available during my care of the patient were reviewed by me and considered in my medical decision making (see chart for details).  Clinical Course as of Jan 20 2338  Caleen Essex Jan 20, 2018  2334 21yo male brought in by fiance for nosebleed today, states 2 days ago had possible syncopal episode and has had random whole body jerking movement since that time.    [LM]  2335 Unremarkable with exception of dried blood in the right nares.  Patient did have one episode of simultaneous arm and leg jerking lasting 1 second or less, this episode occurred when he was talking about the body jerking episodes he is been experienced for the past 2 days.  CT had unremarkable, lab work without significant findings.  Urine drug screen not complete however his episode happened 2 days ago and has likely metabolized any drugs he would have come in contact with from 2 days ago.  Discussed with Dr. Deretha Emory, ED attending, agrees with plan for discharge home follow with PCP with referral to neurology.   [LM]  2339 Events do not sound consistent with seizure however advised patient not to drive until he is cleared by PCP or neurology.   [LM]    Clinical Course User Index [LM] Jeannie Fend, PA-C   Final Clinical Impressions(s) / ED Diagnoses   Final diagnoses:  Epistaxis  Jerky body movements    ED Discharge Orders    None       Jeannie Fend, PA-C 01/20/18 2336    Jeannie Fend, PA-C 01/20/18 2440    Vanetta Mulders, MD 01/24/18 1755

## 2018-01-20 NOTE — Discharge Instructions (Addendum)
Follow-up with your primary care provider, referral given for neurology, call on Monday for an appointment.  Return to the ER for any worsening or concerning symptoms. Avoid all drugs and alcohol until further evaluation.

## 2018-01-20 NOTE — ED Notes (Signed)
CBG 100 

## 2018-01-20 NOTE — ED Notes (Signed)
ED Provider at bedside. 

## 2018-01-21 LAB — RAPID URINE DRUG SCREEN, HOSP PERFORMED
AMPHETAMINES: NOT DETECTED
Barbiturates: NOT DETECTED
Benzodiazepines: NOT DETECTED
Cocaine: NOT DETECTED
Opiates: NOT DETECTED
Tetrahydrocannabinol: NOT DETECTED

## 2018-01-24 ENCOUNTER — Encounter: Payer: Self-pay | Admitting: Diagnostic Neuroimaging

## 2018-01-24 ENCOUNTER — Ambulatory Visit: Payer: Federal, State, Local not specified - PPO | Admitting: Diagnostic Neuroimaging

## 2018-01-24 VITALS — BP 134/78 | HR 99 | Ht 69.0 in | Wt 174.4 lb

## 2018-01-24 DIAGNOSIS — R569 Unspecified convulsions: Secondary | ICD-10-CM

## 2018-01-24 NOTE — Patient Instructions (Signed)
-   check MRI brain and EEG  - According to [place] law, you can not drive unless you are seizure / syncope free for at least 6 months and under physician's care.   - Please maintain precautions. Do not participate in activities where a loss of awareness could harm you or someone else. No swimming alone, no tub bathing, no hot tubs, no driving, no operating motorized vehicles (cars, ATVs, motocycles, etc), lawnmowers, power tools or firearms. No standing at heights, such as rooftops, ladders or stairs. Avoid hot objects such as stoves, heaters, open fires. Wear a helmet when riding a bicycle, scooter, skateboard, etc. and avoid areas of traffic. Set your water heater to 120 degrees or less. 

## 2018-01-24 NOTE — Progress Notes (Signed)
[facility]  PATIENT: Jeffery Johnson DOB: 05/10/1995  REFERRING CLINICIAN: ER  HISTORY FROM: patient and fiance REASON FOR VISIT: new consult    HISTORICAL  CHIEF COMPLAINT:  Chief Complaint  Patient presents with  . New Patient (Initial Visit)    Rm 6, fiance, Delaney  . ED referral: Seizures vs Syncope    no more episodes, has some body jerks at times.  Speech issue.     HISTORY OF PRESENT ILLNESS:   22 year old male here for evaluation of seizure.  01/18/18 patient was with some friends inside of their garage, had 2 beers and some cannabis, near an electric heater and some blankets, when all of a sudden he stood up and walked outside.  His fiance went out to check on him and he was acting strangely.  Then all of a sudden his eyes glazed over and he started to fall to the ground.  His left hand was stiff and his right hand was picking at his own clothing.  He was shaky and jittery.  This lasted for 10 minutes.  No tongue biting or incontinence.  Afterwards he was confused, started to wake up unable to speak.  He stated that the rest of the night continuously talking.  He was having intermittent whole body muscle jerks for the next few days.  On 01/20/18 patient had nosebleed continued intermittent whole body muscle jerks, and therefore went to the emergency room for evaluation.  CT scan and lab testing were unremarkable.  Patient was referred to neurology clinic for further evaluation.  No other prodromal triggering factors.  No recent infections, change in medicine, change in sleep.  He has been under more stress lately related to college exams.  He has been on stable anti-depressant medication and antianxiety medication.  He takes some attention deficit disorder medication, stable doses for past 6 months.   REVIEW OF SYSTEMS: Full 14 system review of systems performed and negative with exception of: Memory loss insomnia snoring headache numbness seizure passing  out blood in stool.  ALLERGIES: No Known Allergies  HOME MEDICATIONS: Outpatient Medications Prior to Visit  Medication Sig Dispense Refill  . busPIRone (BUSPAR) 30 MG tablet Take 30 mg by mouth every morning.    . cetirizine (ZYRTEC) 10 MG tablet Take 10 mg by mouth daily.    . clonazePAM (KLONOPIN) 1 MG tablet Take 1 mg by mouth 2 (two) times daily as needed.     . diphenhydrAMINE (BENADRYL) 25 MG tablet Take 50 mg by mouth at bedtime as needed.     Marland Kitchen. escitalopram (LEXAPRO) 10 MG tablet Take 10 mg by mouth daily. Takes with 20mg  tablet for total of 30mg  daily    . escitalopram (LEXAPRO) 20 MG tablet Take 20 mg by mouth daily. Takes with 10mg  tablet for total of 30mg .    . Melatonin 10 MG TABS Take 10 mg by mouth daily.    . montelukast (SINGULAIR) 10 MG tablet Take 10 mg by mouth at bedtime.    . Multiple Vitamin (MULTIVITAMIN) tablet Take 1 tablet by mouth daily.    Marland Kitchen. omeprazole (PRILOSEC) 20 MG capsule Take 20 mg by mouth daily.    . Probiotic Product (ALIGN PO) Take by mouth daily.     Marland Kitchen. VYVANSE 40 MG capsule Take 40 mg by mouth every morning.     Marland Kitchen. ZOLMitriptan (ZOMIG) 2.5 MG tablet Take 2.5 mg by mouth as needed for migraine or headache.    . zonisamide (ZONEGRAN) 100  MG capsule Take 300 mg by mouth at bedtime.    . clonazePAM (KLONOPIN) 0.5 MG tablet Take 0.5 mg by mouth 2 (two) times daily as needed for anxiety.    . Diclofenac Sodium (PENNSAID) 2 % SOLN Place 2 g onto the skin 2 (two) times daily. 112 g 3  . lactobacillus acidophilus (BACID) TABS tablet Take 2 tablets by mouth 3 (three) times daily.    Marland Kitchen lisdexamfetamine (VYVANSE) 50 MG capsule Take 50 mg by mouth daily.    . Melatonin 1 MG TABS Take 1 tablet by mouth at bedtime as needed (sleep).     No facility-administered medications prior to visit.     PAST MEDICAL HISTORY: Past Medical History:  Diagnosis Date  . ADHD (attention deficit hyperactivity disorder)   . Anxiety   . Depression   . Excessive daytime  sleepiness 07/05/2014  . Insomnia due to mental condition 07/05/2014  . Migraine   . Seasonal allergies     PAST SURGICAL HISTORY: No past surgical history on file.  FAMILY HISTORY: Family History  Problem Relation Age of Onset  . Breast cancer Mother   . Skin cancer Mother   . Stroke Maternal Grandfather   . Heart disease Maternal Grandfather   . Diabetes Maternal Grandfather   . Stroke Paternal Grandfather   . Heart disease Paternal Grandfather   . Seizures Maternal Grandmother   . Seizures Cousin        mothers side     SOCIAL HISTORY: Social History   Socioeconomic History  . Marital status: Single    Spouse name: Not on file  . Number of children: Not on file  . Years of education: Not on file  . Highest education level: Not on file  Occupational History  . Not on file  Social Needs  . Financial resource strain: Not on file  . Food insecurity:    Worry: Not on file    Inability: Not on file  . Transportation needs:    Medical: Not on file    Non-medical: Not on file  Tobacco Use  . Smoking status: Light Tobacco Smoker    Types: Cigarettes  . Smokeless tobacco: Never Used  . Tobacco comment: socially  Substance and Sexual Activity  . Alcohol use: Yes    Alcohol/week: 0.0 standard drinks    Comment: socially  . Drug use: Yes    Types: Marijuana    Comment: uses every 2-3 months  . Sexual activity: Not on file  Lifestyle  . Physical activity:    Days per week: Not on file    Minutes per session: Not on file  . Stress: Not on file  Relationships  . Social connections:    Talks on phone: Not on file    Gets together: Not on file    Attends religious service: Not on file    Active member of club or organization: Not on file    Attends meetings of clubs or organizations: Not on file    Relationship status: Not on file  . Intimate partner violence:    Fear of current or ex partner: Not on file    Emotionally abused: Not on file    Physically abused:  Not on file    Forced sexual activity: Not on file  Other Topics Concern  . Not on file  Social History Narrative   Lives home with parents.  Education in college at [employer].  Fiance- Delaney.  Caffeine - 1 every other  day.     PHYSICAL EXAM  GENERAL EXAM/CONSTITUTIONAL: Vitals:  Vitals:   01/24/18 1237  BP: 134/78  Pulse: 99  Weight: 174 lb 6.4 oz (79.1 kg)  Height: 5\' 9"  (1.753 m)     Body mass index is 25.75 kg/m. Wt Readings from Last 3 Encounters:  01/24/18 174 lb 6.4 oz (79.1 kg)  01/20/18 180 lb (81.6 kg)  08/03/17 171 lb (77.6 kg)     Patient is in no distress; well developed, nourished and groomed; neck is supple  CARDIOVASCULAR:  Examination of carotid arteries is normal; no carotid bruits  Regular rate and rhythm, no murmurs  Examination of peripheral vascular system by observation and palpation is normal  EYES:  Ophthalmoscopic exam of optic discs and posterior segments is normal; no papilledema or hemorrhages  No exam data present  MUSCULOSKELETAL:  Gait, strength, tone, movements noted in Neurologic exam below  NEUROLOGIC: MENTAL STATUS:  No flowsheet data found.  awake, alert, oriented to person, place and time  recent and remote memory intact  normal attention and concentration  language fluent, comprehension intact, naming intact  fund of knowledge appropriate  CRANIAL NERVE:   2nd - no papilledema on fundoscopic exam  2nd, 3rd, 4th, 6th - pupils equal and reactive to light, visual fields full to confrontation, extraocular muscles intact, no nystagmus  5th - facial sensation symmetric  7th - facial strength symmetric  8th - hearing intact  9th - palate elevates symmetrically, uvula midline  11th - shoulder shrug symmetric  12th - tongue protrusion midline  MOTOR:   normal bulk and tone, full strength in the BUE, BLE  SENSORY:   normal and symmetric to light touch, pinprick, temperature, vibration  COORDINATION:     finger-nose-finger, fine finger movements normal  REFLEXES:   deep tendon reflexes present and symmetric  GAIT/STATION:   narrow based gait; able to walk on toes, heels and tandem; romberg is negative     DIAGNOSTIC DATA (LABS, IMAGING, TESTING) - I reviewed patient records, labs, notes, testing and imaging myself where available.  Lab Results  Component Value Date   WBC 8.3 01/20/2018   HGB 15.9 01/20/2018   HCT 46.6 01/20/2018   MCV 90.5 01/20/2018   PLT 307 01/20/2018      Component Value Date/Time   NA 137 01/20/2018 2152   K 3.8 01/20/2018 2152   CL 105 01/20/2018 2152   CO2 22 01/20/2018 2152   GLUCOSE 111 (H) 01/20/2018 2152   BUN 13 01/20/2018 2152   CREATININE 1.00 01/20/2018 2152   CALCIUM 9.2 01/20/2018 2152   PROT 8.0 01/11/2014 1848   ALBUMIN 4.8 01/11/2014 1848   AST 18 01/11/2014 1848   ALT 14 01/11/2014 1848   ALKPHOS 64 01/11/2014 1848   BILITOT 1.4 (H) 01/11/2014 1848   GFRNONAA >60 01/20/2018 2152   GFRAA >60 01/20/2018 2152   No results found for: CHOL, HDL, LDLCALC, LDLDIRECT, TRIG, CHOLHDL No results found for: ZOXW9U No results found for: VITAMINB12 No results found for: TSH  01/20/18 Opiates NONE DETECTED NONE DETECTED   Cocaine NONE DETECTED NONE DETECTED   Benzodiazepines NONE DETECTED NONE DETECTED   Amphetamines NONE DETECTED NONE DETECTED   Tetrahydrocannabinol NONE DETECTED NONE DETECTED   Barbiturates NONE DETECTED NONE DETECTED     01/20/18 CT head [I reviewed images myself and agree with interpretation. -VRP]  - negative     ASSESSMENT AND PLAN  22 y.o. year old male here with new onset seizure on  01/18/2018, with features suggesting complex partial seizure with secondary generalization.  We will proceed with further work-up.  Dx:  1. New onset seizure (HCC)      PLAN:  - check MRI brain and EEG  - Request psychiatrist to review medications, and consider reduction in buspirone and vyvanse (which may lower  seizure threshold)  - According to [place] law, you can not drive unless you are seizure / syncope free for at least 6 months and under physician's care.   - Please maintain precautions. Do not participate in activities where a loss of awareness could harm you or someone else. No swimming alone, no tub bathing, no hot tubs, no driving, no operating motorized vehicles (cars, ATVs, motocycles, etc), lawnmowers, power tools or firearms. No standing at heights, such as rooftops, ladders or stairs. Avoid hot objects such as stoves, heaters, open fires. Wear a helmet when riding a bicycle, scooter, skateboard, etc. and avoid areas of traffic. Set your water heater to 120 degrees or less.  Orders Placed This Encounter  Procedures  . MR BRAIN W WO CONTRAST  . EEG adult   Return in about 4 months (around 05/25/2018).  I reviewed images, labs, notes, records myself. I summarized findings and reviewed with patient, for this high risk condition (new onset seizure) requiring high complexity decision making.    Suanne Marker, MD 01/24/2018, 1:19 PM Certified in Neurology, Neurophysiology and Neuroimaging  [facility] [address] [place], [place] [address] [phone]

## 2018-01-25 ENCOUNTER — Telehealth: Payer: Self-pay | Admitting: Diagnostic Neuroimaging

## 2018-01-25 NOTE — Telephone Encounter (Signed)
Pt is asking for a call to discuss the scheduling of his MRI

## 2018-01-25 NOTE — Telephone Encounter (Signed)
lvm for pt to call back about scheduling mri  [employer] fed auth: NPR ref # [id number]

## 2018-01-26 NOTE — Telephone Encounter (Signed)
Sp[facility] to the patient [facility]

## 2018-01-26 NOTE — Telephone Encounter (Signed)
I spoke to the patient and informed him how much it would cost he stated he would give me a call back to schedule.

## 2018-01-26 NOTE — Telephone Encounter (Signed)
Pt has called [facility] Burtonmily about MRI, he is asking for a call back

## 2018-01-31 ENCOUNTER — Ambulatory Visit: Payer: Federal, State, Local not specified - PPO

## 2018-01-31 ENCOUNTER — Telehealth: Payer: Self-pay | Admitting: *Deleted

## 2018-01-31 DIAGNOSIS — R569 Unspecified convulsions: Secondary | ICD-10-CM

## 2018-01-31 MED ORDER — GADOBENATE DIMEGLUMINE 529 MG/ML IV SOLN
15.0000 mL | Freq: Once | INTRAVENOUS | Status: AC | PRN
  Administered 2018-01-31: 15 mL via INTRAVENOUS

## 2018-01-31 NOTE — Telephone Encounter (Signed)
LVM informing the patient that his MRI brain showed normal findings for his age. Advised it did show chronic sinusitis particularly on the left side. Advised if he has any symptoms of sinusitis or sinus congestion he should follow up with his PCP. Left office number for any questions.

## 2018-01-31 NOTE — Progress Notes (Signed)
Brain MRI with and without contrast shows normal for age findings. There is evidence of chronic sinusitis particularly in the left maxillary sinus, but no acute changes; patient is advised to follow-up with primary care if he has symptoms of sinus congestion or sinusitis. Janene Harveysa

## 2018-02-22 ENCOUNTER — Ambulatory Visit: Payer: Federal, State, Local not specified - PPO | Admitting: Diagnostic Neuroimaging

## 2018-02-22 DIAGNOSIS — R569 Unspecified convulsions: Secondary | ICD-10-CM | POA: Diagnosis not present

## 2018-02-24 NOTE — Procedures (Signed)
   [facility]  EEG (ELECTROENCEPHALOGRAM) REPORT   STUDY DATE: 02/22/18 PATIENT NAME: Jeffery EvansCaleb Johnson DOB: 11/28/1995 MRN: [id number]  ORDERING CLINICIAN: Joycelyn SchmidVikram Penumalli, MD   TECHNOLOGIST: Charlett BlakeN Willard  TECHNIQUE: Electroencephalogram was recorded utilizing standard 10-20 system of lead placement and reformatted into average and bipolar montages.  RECORDING TIME: 23 minutes  ACTIVATION: hyperventilation and photic stimulation  CLINICAL INFORMATION: 22 year old male with seizure.  FINDINGS: Posterior dominant background rhythms, which attenuate with eye opening, ranging 9-10 hertz and 40-50 microvolts. No focal, lateralizing, epileptiform activity or seizures are seen. Patient recorded in the awake and drowsy state. EKG channel shows regular rhythm of 70-75 beats per minute.   IMPRESSION:   Normal EEG in the awake and drowsy states.     INTERPRETING PHYSICIAN:  Suanne MarkerVIKRAM R. PENUMALLI, MD Certified in Neurology, Neurophysiology and Neuroimaging  [facility] [address], [address] [place], [place] [address] [phone]

## 2018-03-02 ENCOUNTER — Telehealth: Payer: Self-pay | Admitting: *Deleted

## 2018-03-02 NOTE — Telephone Encounter (Signed)
LMVM for pt on cell, (ok per dpr) that his EEG results normal.  He is to call back if questions.

## 2018-03-09 DIAGNOSIS — G43719 Chronic migraine without aura, intractable, without status migrainosus: Secondary | ICD-10-CM | POA: Diagnosis not present

## 2018-03-09 DIAGNOSIS — G43019 Migraine without aura, intractable, without status migrainosus: Secondary | ICD-10-CM | POA: Diagnosis not present

## 2018-03-11 DIAGNOSIS — H6693 Otitis media, unspecified, bilateral: Secondary | ICD-10-CM | POA: Diagnosis not present

## 2018-03-14 ENCOUNTER — Telehealth: Payer: Self-pay | Admitting: *Deleted

## 2018-03-14 NOTE — Telephone Encounter (Signed)
LVM advising patietn his EEG was normal. Advised him to continue to follow Dr Richrd Humbles advice and recommendations, reviewed those per last office note. Left number for any questions.

## 2018-03-21 ENCOUNTER — Emergency Department (HOSPITAL_COMMUNITY)
Admission: EM | Admit: 2018-03-21 | Discharge: 2018-03-21 | Disposition: A | Discharge status: Other Acute Inpatient Hospital | Payer: Federal, State, Local not specified - PPO | Source: Home / Self Care | Attending: Emergency Medicine | Admitting: Emergency Medicine

## 2018-03-21 ENCOUNTER — Encounter (HOSPITAL_COMMUNITY): Payer: Self-pay

## 2018-03-21 ENCOUNTER — Other Ambulatory Visit: Payer: Self-pay

## 2018-03-21 ENCOUNTER — Inpatient Hospital Stay (HOSPITAL_COMMUNITY)
Admission: AD | Admit: 2018-03-21 | Discharge: 2018-03-24 | DRG: 885 | Disposition: A | Discharge status: Home / Self Care | Payer: Federal, State, Local not specified - PPO | Source: Intra-hospital | Attending: Psychiatry | Admitting: Psychiatry

## 2018-03-21 DIAGNOSIS — F329 Major depressive disorder, single episode, unspecified: Secondary | ICD-10-CM | POA: Diagnosis not present

## 2018-03-21 DIAGNOSIS — F909 Attention-deficit hyperactivity disorder, unspecified type: Secondary | ICD-10-CM | POA: Diagnosis not present

## 2018-03-21 DIAGNOSIS — F17210 Nicotine dependence, cigarettes, uncomplicated: Secondary | ICD-10-CM

## 2018-03-21 DIAGNOSIS — F419 Anxiety disorder, unspecified: Secondary | ICD-10-CM | POA: Diagnosis present

## 2018-03-21 DIAGNOSIS — R45851 Suicidal ideations: Secondary | ICD-10-CM | POA: Diagnosis present

## 2018-03-21 DIAGNOSIS — F23 Brief psychotic disorder: Secondary | ICD-10-CM | POA: Diagnosis present

## 2018-03-21 DIAGNOSIS — F411 Generalized anxiety disorder: Secondary | ICD-10-CM | POA: Diagnosis not present

## 2018-03-21 DIAGNOSIS — Z79899 Other long term (current) drug therapy: Secondary | ICD-10-CM

## 2018-03-21 DIAGNOSIS — Z818 Family history of other mental and behavioral disorders: Secondary | ICD-10-CM | POA: Diagnosis not present

## 2018-03-21 DIAGNOSIS — R45850 Homicidal ideations: Secondary | ICD-10-CM | POA: Diagnosis not present

## 2018-03-21 DIAGNOSIS — Z23 Encounter for immunization: Secondary | ICD-10-CM

## 2018-03-21 DIAGNOSIS — F29 Unspecified psychosis not due to a substance or known physiological condition: Secondary | ICD-10-CM | POA: Diagnosis not present

## 2018-03-21 DIAGNOSIS — G4700 Insomnia, unspecified: Secondary | ICD-10-CM | POA: Diagnosis not present

## 2018-03-21 DIAGNOSIS — F900 Attention-deficit hyperactivity disorder, predominantly inattentive type: Secondary | ICD-10-CM | POA: Diagnosis not present

## 2018-03-21 LAB — URINALYSIS, ROUTINE W REFLEX MICROSCOPIC
Bilirubin Urine: NEGATIVE
Glucose, UA: NEGATIVE mg/dL
Hgb urine dipstick: NEGATIVE
KETONES UR: NEGATIVE mg/dL
Leukocytes, UA: NEGATIVE
Nitrite: NEGATIVE
Protein, ur: NEGATIVE mg/dL
Specific Gravity, Urine: 1.008 (ref 1.005–1.030)
pH: 7 (ref 5.0–8.0)

## 2018-03-21 LAB — COMPREHENSIVE METABOLIC PANEL
ALK PHOS: 43 U/L (ref 38–126)
ALT: 33 U/L (ref 0–44)
ANION GAP: 8 (ref 5–15)
AST: 18 U/L (ref 15–41)
Albumin: 5 g/dL (ref 3.5–5.0)
BILIRUBIN TOTAL: 1.8 mg/dL — AB (ref 0.3–1.2)
BUN: 11 mg/dL (ref 6–20)
CALCIUM: 9.2 mg/dL (ref 8.9–10.3)
CO2: 24 mmol/L (ref 22–32)
Chloride: 108 mmol/L (ref 98–111)
Creatinine, Ser: 0.82 mg/dL (ref 0.61–1.24)
GLUCOSE: 102 mg/dL — AB (ref 70–99)
POTASSIUM: 3.4 mmol/L — AB (ref 3.5–5.1)
Sodium: 140 mmol/L (ref 135–145)
TOTAL PROTEIN: 7.8 g/dL (ref 6.5–8.1)

## 2018-03-21 LAB — CBC
HCT: 45.8 % (ref 39.0–52.0)
Hemoglobin: 15.5 g/dL (ref 13.0–17.0)
MCH: 30.7 pg (ref 26.0–34.0)
MCHC: 33.8 g/dL (ref 30.0–36.0)
MCV: 90.7 fL (ref 80.0–100.0)
PLATELETS: 298 10*3/uL (ref 150–400)
RBC: 5.05 MIL/uL (ref 4.22–5.81)
RDW: 12.2 % (ref 11.5–15.5)
WBC: 7.7 10*3/uL (ref 4.0–10.5)
nRBC: 0 % (ref 0.0–0.2)

## 2018-03-21 LAB — RAPID URINE DRUG SCREEN, HOSP PERFORMED
Amphetamines: NOT DETECTED
Barbiturates: NOT DETECTED
Benzodiazepines: NOT DETECTED
COCAINE: NOT DETECTED
OPIATES: NOT DETECTED
TETRAHYDROCANNABINOL: NOT DETECTED

## 2018-03-21 LAB — ACETAMINOPHEN LEVEL

## 2018-03-21 LAB — SALICYLATE LEVEL

## 2018-03-21 LAB — ETHANOL

## 2018-03-21 MED ORDER — IBUPROFEN 200 MG PO TABS
600.0000 mg | ORAL_TABLET | Freq: Three times a day (TID) | ORAL | Status: DC | PRN
  Administered 2018-03-21: 600 mg via ORAL
  Filled 2018-03-21: qty 3

## 2018-03-21 MED ORDER — TRAZODONE HCL 50 MG PO TABS
50.0000 mg | ORAL_TABLET | Freq: Every evening | ORAL | Status: DC | PRN
  Administered 2018-03-21: 50 mg via ORAL
  Filled 2018-03-21 (×3): qty 1

## 2018-03-21 MED ORDER — ALUM & MAG HYDROXIDE-SIMETH 200-200-20 MG/5ML PO SUSP: 30.0000 mL | ORAL | Status: DC | PRN

## 2018-03-21 MED ORDER — MAGNESIUM HYDROXIDE 400 MG/5ML PO SUSP: 30.0000 mL | Freq: Every day | ORAL | Status: DC | PRN

## 2018-03-21 MED ORDER — HYDROXYZINE HCL 25 MG PO TABS
25.0000 mg | ORAL_TABLET | Freq: Three times a day (TID) | ORAL | Status: DC | PRN
  Administered 2018-03-21: 25 mg via ORAL
  Filled 2018-03-21: qty 1

## 2018-03-21 MED ORDER — ACETAMINOPHEN 325 MG PO TABS: 650.0000 mg | ORAL_TABLET | Freq: Four times a day (QID) | ORAL | Status: DC | PRN

## 2018-03-21 NOTE — BH Assessment (Signed)
Assessment Note  Jeffery Johnson is an 23 y.o. male presenting voluntarily to [facility] ED for a psychiatric evaluation. Patient is accompanied by his mother, Geoffery Spruce, who is present for assessment at request of patient and provides collateral information. Patient was referred to ED after a psychiatry appointment today, where he is treated for depression, anxiety, and ADHD. Per mother, doctor believes he is having a psychotic episode. Patient states he became very paranoid, refused to participate in session, and refused to sign any routine paperwork. Patient stated the previous evening she was experiencing auditory hallucinations and got so frustrated that he grabbed a knife and put it to his wrist but "couldn't go through with it." Patient endorses muffled auditory hallucination and visual hallucinations of "silhouettes." Patient states he has experienced hallucinations intermittently for years but they have worsened recently. Patient describes paranoia about his girlfriend cheating on him and feels its necessary to check her phone at all times. Patient reports occasional THC and alcohol use. Patient denies being currently suicidal. Patient denies HI. Patient does not have any criminal charges or trauma history. There is a family history of mental illness including his father, who is bipolar, his mother who has depression, and an uncle who committed suicide.   Patient was alert and oriented x 4. He was dressed in scrubs. His speech was coherent and he made good eye contact. Patient's mood was anxious and affect was congruent. Patient's insight, judgement, and impulse control are impaired. Patient did not appear to be responding to internal stimuli or experiencing delusional thought content at time of assessment.   Diagnosis: F23 Brief psychotic disorder  Past Medical History:  Past Medical History:  Diagnosis Date  . ADHD (attention deficit hyperactivity disorder)   . Anxiety   . Depression   . Excessive daytime  sleepiness 07/05/2014  . Insomnia due to mental condition 07/05/2014  . Migraine   . Seasonal allergies     No past surgical history on file.  Family History:  Family History  Problem Relation Age of Onset  . Breast cancer Mother   . Skin cancer Mother   . Stroke Maternal Grandfather   . Heart disease Maternal Grandfather   . Diabetes Maternal Grandfather   . Stroke Paternal Grandfather   . Heart disease Paternal Grandfather   . Seizures Maternal Grandmother   . Seizures Cousin        mothers side     Social History:  reports that he has been smoking cigarettes. He has never used smokeless tobacco. He reports current alcohol use. He reports current drug use. Drug: Marijuana.  Additional Social History:  Alcohol / Drug Use Pain Medications: see MAR Prescriptions: see MAR Over the Counter: see MAR History of alcohol / drug use?: No history of alcohol / drug abuse  CIWA: CIWA-Ar BP: (!) 146/92 Pulse Rate: (!) 118 COWS:    Allergies: No Known Allergies  Home Medications: (Not in a hospital admission)   OB/GYN Status:  No LMP for male patient.  General Assessment Data Location of Assessment: [facility] TTS Assessment: In system Is this a Tele or Face-to-Face Assessment?: Face-to-Face Is this an Initial Assessment or a Re-assessment for this encounter?: Initial Assessment Patient Accompanied by:: Parent(mother- Geoffery Spruce) Language Other than English: No Living Arrangements: (parents house) What gender do you identify as?: Male Marital status: Long term relationship Maiden name: Smethers Pregnancy Status: No Living Arrangements: Parent, Spouse/significant other Can pt return to current living arrangement?: Yes Admission Status: Voluntary Is patient capable of  signing voluntary admission?: Yes Referral Source: Psychiatrist Insurance type: BCBS     Crisis Care Plan Living Arrangements: Parent, Spouse/significant other     Risk to self with the past 6 months Suicidal  Ideation: No-Not Currently/Within Last 6 Months Has patient been a risk to self within the past 6 months prior to admission? : Yes Suicidal Intent: No Has patient had any suicidal intent within the past 6 months prior to admission? : No Is patient at risk for suicide?: Yes Suicidal Plan?: No-Not Currently/Within Last 6 Months Has patient had any suicidal plan within the past 6 months prior to admission? : No Access to Means: Yes Specify Access to Suicidal Means: knife What has been your use of drugs/alcohol within the last 12 months?: occasional THC and alcohol use Previous Attempts/Gestures: No How many times?: 0 Other Self Harm Risks: none reported Triggers for Past Attempts: Hallucinations Intentional Self Injurious Behavior: None Family Suicide History: Yes(uncle) Recent stressful life event(s): Conflict (Comment), Other (Comment)(arguing with girlfriend, starting school) Persecutory voices/beliefs?: No Depression: Yes Depression Symptoms: Insomnia, Despondent, Isolating, Fatigue, Guilt, Loss of interest in usual pleasures, Feeling worthless/self pity, Feeling angry/irritable Substance abuse history and/or treatment for substance abuse?: No Suicide prevention information given to non-admitted patients: Not applicable  Risk to Others within the past 6 months Homicidal Ideation: No Does patient have any lifetime risk of violence toward others beyond the six months prior to admission? : No Thoughts of Harm to Others: No Current Homicidal Intent: No Current Homicidal Plan: No Access to Homicidal Means: No Identified Victim: none History of harm to others?: No Assessment of Violence: None Noted Violent Behavior Description: none Does patient have access to weapons?: No Criminal Charges Pending?: No Does patient have a court date: No Is patient on probation?: No  Psychosis Hallucinations: Auditory, Visual Delusions: Persecutory  Mental Status Report Appearance/Hygiene: In  scrubs Eye Contact: Good Motor Activity: Freedom of movement Speech: Logical/coherent Level of Consciousness: Alert Mood: Depressed, Anxious Affect: Anxious, Depressed Anxiety Level: Moderate Thought Processes: Coherent, Relevant Judgement: Partial Orientation: Place, Person, Situation, Time Obsessive Compulsive Thoughts/Behaviors: None  Cognitive Functioning Concentration: Decreased Memory: Recent Intact, Remote Intact Is patient IDD: No Insight: Fair Impulse Control: Poor Appetite: Poor Have you had any weight changes? : Loss Amount of the weight change? (lbs): 10 lbs Sleep: Decreased Total Hours of Sleep: 4 Vegetative Symptoms: None  ADLScreening [facility] Assessment Services) Patient's cognitive ability adequate to safely complete daily activities?: Yes Patient able to express need for assistance with ADLs?: Yes Independently performs ADLs?: Yes (appropriate for developmental age)  Prior Inpatient Therapy Prior Inpatient Therapy: No  Prior Outpatient Therapy Prior Outpatient Therapy: Yes Prior Therapy Dates: ongoing Prior Therapy Facilty/Provider(s): Dr. Madaline Guthrie Reason for Treatment: anxiety, depression, ADHD Does patient have an ACCT team?: No Does patient have Intensive In-House Services?  : No Does patient have [facility] services? : No Does patient have P4CC services?: No  ADL Screening (condition at time of admission) Patient's cognitive ability adequate to safely complete daily activities?: Yes Is the patient deaf or have difficulty hearing?: No Does the patient have difficulty seeing, even when wearing glasses/contacts?: No Does the patient have difficulty concentrating, remembering, or making decisions?: No Patient able to express need for assistance with ADLs?: Yes Does the patient have difficulty dressing or bathing?: No Independently performs ADLs?: Yes (appropriate for developmental age) Does the patient have difficulty walking or climbing stairs?:  No Weakness of Legs: None Weakness of Arms/Hands: None     Therapy Consults (  therapy consults require a physician order) PT Evaluation Needed: No OT Evalulation Needed: No SLP Evaluation Needed: No Abuse/Neglect Assessment (Assessment to be complete while patient is alone) Abuse/Neglect Assessment Can Be Completed: Yes Physical Abuse: Denies Verbal Abuse: Denies Sexual Abuse: Denies Exploitation of patient/patient's resources: Denies Self-Neglect: Denies Values / Beliefs Cultural Requests During Hospitalization: None Spiritual Requests During Hospitalization: None Consults Spiritual Care Consult Needed: No Social Work Consult Needed: No Merchant navy officerAdvance Directives (For Healthcare) Does Patient Have a Medical Advance Directive?: No Would patient like information on creating a medical advance directive?: No - Patient declined          Disposition: Per Malachy Chamberakia Starkes, PMHNP patient meets in patient criteria. Disposition Initial Assessment Completed for this Encounter: Yes  On Site Evaluation by:   Reviewed with Physician:    Celedonio MiyamotoMeredith  Brealyn Baril 03/21/2018 4:25 PM

## 2018-03-21 NOTE — ED Triage Notes (Signed)
Pt. Sent from Psychiatrist office for psychotic episode. Has been recently endorsing SI, has strong desire to harm self but hasn't been able to follow through with his plan. He endorses feeling suicidal, paranoid and anxious.

## 2018-03-21 NOTE — BH Assessment (Signed)
Patient has been accepted to [facility] [id number] at [phone]

## 2018-03-21 NOTE — ED Notes (Signed)
Pt transferred to behavior health via [facility] transportation. Pt safe , and stable.

## 2018-03-21 NOTE — ED Notes (Signed)
Pt alert and oriented. Pt c/o si with no plan. Pt contract to safety. Pt c/o avh in the past, but not at this time. Pt noted with family at this bedside. Pt denies any pain. Pt safe, will continue to monitor.

## 2018-03-21 NOTE — Tx Team (Signed)
Initial Treatment Plan 03/21/2018 11:03 PM Jeffery Johnson JOI:[id number]    PATIENT STRESSORS: Medication change or noncompliance Other: paranoia   PATIENT STRENGTHS: General fund of knowledge Motivation for treatment/growth Supportive family/friends   PATIENT IDENTIFIED PROBLEMS: Risk for suicide  Psychosis  paranoia  "communication, coping skills"               DISCHARGE CRITERIA:  Improved stabilization in mood, thinking, and/or behavior Verbal commitment to aftercare and medication compliance  PRELIMINARY DISCHARGE PLAN: Attend PHP/IOP Outpatient therapy  PATIENT/FAMILY INVOLVEMENT: This treatment plan has been presented to and reviewed with the patient, Jeffery Johnson.  The patient and family have been given the opportunity to ask questions and make suggestions.  Delos Haring, RN 03/21/2018, 11:03 PM

## 2018-03-21 NOTE — Progress Notes (Addendum)
Patient ID: Jeffery Johnson, male   DOB: [birth year], 23 y.o.   MRN: [id number] Admission Note:  D:22 yr male who presents [facility] in no acute distress for the treatment of SI, psychosis and Depression. Pt appears flat and depressed. Pt was calm and cooperative with admission process. Pt presents with passive SI / AH and contracts for safety upon admission. Pt denies HI/VH . Pt stated he was paranoid for the past few months , but his AVH and paranoia increased drastically in the past 3 days. Pt thought he needed to come in to get help, this is pt 1st inpatient  Admission. Pt stated he had a Seizure in November that was possibly substance induced , but not verified why he had it. Pt did not have seizures before and has not had one since.   A:Skin was assessed and found to be clear of any abnormal marks . PT searched and no contraband found, POC and unit policies explained and understanding verbalized. Consents obtained. Food and fluids offered, and fluids accepted.   R:Pt had no additional questions or concerns.

## 2018-03-21 NOTE — ED Provider Notes (Signed)
[facility]   CSN: [id number] Arrival date & time: 03/21/18  1437     History   Chief Complaint Chief Complaint  Patient presents with  . Paranoid  . Psychiatric Evaluation    HPI Jeffery Johnson is a 23 y.o. male with a past medical history of ADHD, anxiety, depression, who presents today from his psychiatrist Dr. Irena ReichmannJane Steiner's office for what she reports is a acute psychotic episode.  Patient and his mother provide the history.  Since last fall patient has had increasing auditory hallucinations.  He reports that he has had visual hallucinations of a shadow since he was a child.  He reports compliance with all of his medications.  He reports that he last night had a large knife and pointed at his wrist as he wanted to kill himself.  He reports that he still is feeling like he wants to kill himself and still has a plan to slit his wrists.  He has previous suicidal ideation/attempt approximately a year ago.  He reports that he has had some pain in his right ear, was recently treated for an ear infection and has completed the antibiotics.  He also reports intermittent diffuse genital pain.  He says that on New Year's Eve he was intimate with his fiance when "something got jammed."  He reports that he has had pain that migrates between the base of his penis, and both testicles.  This currently is not painful.  He reports that since then he has been able to get an erection and that was not painful.  He denies any new curves or deformities.  He denies any dysuria, hematuria increased frequency or urgency.  Of note patient reportedly had a new onset seizure on 03/22/2017 after he was drinking 240 ounce beers and smoked marijuana.  He has been followed by neurology as an outpatient and has reportedly had normal brain MRI and normal EEG.  HPI  Past Medical History:  Diagnosis Date  . ADHD (attention deficit hyperactivity disorder)   . Anxiety   .  Depression   . Excessive daytime sleepiness 07/05/2014  . Insomnia due to mental condition 07/05/2014  . Migraine   . Seasonal allergies     Patient Active Problem List   Diagnosis Date Noted  . Poor posture 08/03/2017  . Nonallopathic lesion of thoracic region 08/03/2017  . Nonallopathic lesion of sacral region 08/03/2017  . Nonallopathic lesion of lumbosacral region 08/03/2017  . SI (sacroiliac) joint dysfunction 08/03/2017  . Hypersomnia with sleep apnea 10/23/2015  . Excessive daytime sleepiness 07/05/2014  . Insomnia due to mental condition 07/05/2014  . Snoring 07/05/2014    No past surgical history on file.      Home Medications    Prior to Admission medications   Medication Sig Start Date End Date Taking? Authorizing Provider  busPIRone (BUSPAR) 30 MG tablet Take 30 mg by mouth every morning.    [provider]  cetirizine (ZYRTEC) 10 MG tablet Take 10 mg by mouth daily.    [provider]  clonazePAM (KLONOPIN) 1 MG tablet Take 1 mg by mouth 2 (two) times daily as needed.     [provider]  diphenhydrAMINE (BENADRYL) 25 MG tablet Take 50 mg by mouth at bedtime as needed.     [provider]  escitalopram (LEXAPRO) 10 MG tablet Take 10 mg by mouth daily. Takes with 20mg  tablet for total of 30mg  daily    [provider]  escitalopram (LEXAPRO) 20 MG tablet Take 20 mg by mouth daily. Takes with 10mg  tablet for total of 30mg .    [provider]  Melatonin 10 MG TABS Take 10 mg by mouth daily.    [provider]  montelukast (SINGULAIR) 10 MG tablet Take 10 mg by mouth at bedtime.    [provider]  Multiple Vitamin (MULTIVITAMIN) tablet Take 1 tablet by mouth daily.    [provider]  omeprazole (PRILOSEC) 20 MG capsule Take 20 mg by mouth daily.    [provider]  Probiotic Product (ALIGN PO) Take by mouth daily.     [provider]  VYVANSE 40 MG capsule Take 40 mg by  mouth every morning.  01/23/18   [provider]  ZOLMitriptan (ZOMIG) 2.5 MG tablet Take 2.5 mg by mouth as needed for migraine or headache.    [provider]  zonisamide (ZONEGRAN) 100 MG capsule Take 300 mg by mouth at bedtime.    [provider]    Family History Family History  Problem Relation Age of Onset  . Breast cancer Mother   . Skin cancer Mother   . Stroke Maternal Grandfather   . Heart disease Maternal Grandfather   . Diabetes Maternal Grandfather   . Stroke Paternal Grandfather   . Heart disease Paternal Grandfather   . Seizures Maternal Grandmother   . Seizures Cousin        mothers side     Social History Social History   Tobacco Use  . Smoking status: Light Tobacco Smoker    Types: Cigarettes  . Smokeless tobacco: Never Used  . Tobacco comment: socially  Substance Use Topics  . Alcohol use: Yes    Alcohol/week: 0.0 standard drinks    Comment: socially  . Drug use: Yes    Types: Marijuana    Comment: uses every 2-3 months     Allergies   Patient has no known allergies.   Review of Systems Review of Systems  Constitutional: Negative for chills and fever.  HENT: Positive for ear pain.   Respiratory: Negative for chest tightness and shortness of breath.   Cardiovascular: Negative for chest pain, palpitations and leg swelling.  Gastrointestinal: Negative for abdominal pain, diarrhea, nausea and vomiting.  Genitourinary: Positive for penile pain (Intermittent) and testicular pain (Intermittent). Negative for discharge, dysuria and frequency.  Musculoskeletal: Negative for back pain.  Skin: Negative for pallor and wound.  Neurological: Negative for headaches.  Psychiatric/Behavioral: Positive for behavioral problems, hallucinations and suicidal ideas.  All other systems reviewed and are negative.    Physical Exam Updated Vital Signs BP 126/80 (BP Location: Left Arm)   Pulse 91   Temp 98.4 F (36.9 C) (Oral)   Resp  20   Ht 5\' 8"  (1.727 m)   Wt 80.7 kg   SpO2 98%   BMI 27.06 kg/m   Physical Exam Vitals signs and nursing note reviewed. Exam conducted with a chaperone present (Male EMT tech).  Constitutional:      General: He is not in acute distress.    Appearance: He is well-developed. He is not diaphoretic.  HENT:     Head: Normocephalic and atraumatic.     Right Ear: Tympanic membrane, ear canal and external ear normal.     Left Ear: Tympanic membrane, ear canal and external ear normal.  Eyes:     General: No scleral icterus.       Right eye: No discharge.  Left eye: No discharge.     Conjunctiva/sclera: Conjunctivae normal.  Neck:     Musculoskeletal: Normal range of motion.  Cardiovascular:     Rate and Rhythm: Normal rate and regular rhythm.     Pulses: Normal pulses.     Heart sounds: Normal heart sounds.  Pulmonary:     Effort: Pulmonary effort is normal. No respiratory distress.     Breath sounds: Normal breath sounds. No stridor.  Abdominal:     General: There is no distension.     Tenderness: There is no abdominal tenderness.     Hernia: There is no hernia in the right inguinal area or left inguinal area.  Genitourinary:    Penis: Normal and circumcised. No erythema, tenderness, discharge, swelling or lesions.      Scrotum/Testes: Normal. Cremasteric reflex is present.        Right: Mass, tenderness or swelling not present.        Left: Mass, tenderness or swelling not present.     Epididymis:     Right: Normal. Not inflamed. No mass or tenderness.     Left: Normal. Not inflamed. No mass or tenderness.  Musculoskeletal:        General: No deformity.  Skin:    General: Skin is warm and dry.  Neurological:     General: No focal deficit present.     Mental Status: He is alert.     Motor: No abnormal muscle tone.  Psychiatric:        Mood and Affect: Mood is depressed.        Behavior: Behavior normal.        Thought Content: Thought content is not paranoid or  delusional. Thought content includes suicidal ideation. Thought content does not include homicidal ideation. Thought content includes suicidal plan. Thought content does not include homicidal plan.        Judgment: Judgment is impulsive.     Comments: Does not appear to be responding to internal stimuli, but reports hearing his mother and fathers voices about 3/7 and will frequently see the outline of a person.        ED Treatments / Results  Labs (all labs ordered are listed, but only abnormal results are displayed) Labs Reviewed  COMPREHENSIVE METABOLIC PANEL - Abnormal; Notable for the following components:      Result Value   Potassium 3.4 (*)    Glucose, Bld 102 (*)    Total Bilirubin 1.8 (*)    All other components within normal limits  ACETAMINOPHEN LEVEL - Abnormal; Notable for the following components:   Acetaminophen (Tylenol), Serum <10 (*)    All other components within normal limits  ETHANOL  SALICYLATE LEVEL  CBC  RAPID URINE DRUG SCREEN, HOSP PERFORMED  URINALYSIS, ROUTINE W REFLEX MICROSCOPIC    EKG None  Radiology No results found.  Procedures Procedures (including critical care time)  Medications Ordered in ED Medications - No data to display   Initial Impression / Assessment and Plan / ED Course  I have reviewed the triage vital signs and the nursing notes.  Pertinent labs & imaging results that were available during my care of the patient were reviewed by me and considered in my medical decision making (see chart for details).    Patient presents today for evaluation of possible psychosis from his psychiatrist office.  He also has SI and last night held a knife to his wrist but did not cut. He is here with his mother.  He reports auditory and visual hallucinations in addition to feeling anxious.  Labs were obtained and reviewed, no significant hematologic or electrolyte abnormalities.  He did report that he has had ear pain with a recent ear  infection, TMs are unremarkable bilaterally.  He also requested to be evaluated for intermittent penile and scrotal pain.  He reported that on New Year's Eve he was being intimate when he felt a pop and since then he has had intermittent migratory pain in his testicles and penis.  Physical exam is normal without evidence of deformity, swelling, or localized tenderness.  Urine is not consistent with infection.  Given that the pain is been there for 14 days and is intermittent in nature not consistent with a acute threat to penis or testicle.  Recommend outpatient urology follow up. No hernias felt, however suspect he may have an intermittent hernia.  He was not having pain on exam so limited assessment.    Patient was seen by TTS, recommended inpatient treatment.  Patient is here voluntarily.    Patient medically clear for psychiatric disposition.   Final Clinical Impressions(s) / ED Diagnoses   Final diagnoses:  Brief psychotic disorder [facility])    ED Discharge Orders    None       Norman Clay 03/21/18 1914    Shaune Pollack, MD 03/22/18 [id number]

## 2018-03-21 NOTE — ED Notes (Signed)
Bed: [facility] Expected date:  Expected time:  Means of arrival:  Comments: 

## 2018-03-21 NOTE — ED Notes (Signed)
Patient is alert and oriented x 4.  Presents with calm affect and mood.  Denies suicidal thoughts, auditory and visual hallucinations.  Routine safety checks maintained every 15 minutes and via security camera.  Patient is safe on the unit.  Family at bedside.

## 2018-03-22 DIAGNOSIS — F23 Brief psychotic disorder: Principal | ICD-10-CM

## 2018-03-22 LAB — LIPID PANEL
Cholesterol: 249 mg/dL — ABNORMAL HIGH (ref 0–200)
HDL: 51 mg/dL (ref >40)
LDL CALC: 186 mg/dL — AB (ref 0–99)
Total CHOL/HDL Ratio: 4.9 RATIO
Triglycerides: 62 mg/dL (ref <150)
VLDL: 12 mg/dL (ref 0–40)

## 2018-03-22 LAB — HEMOGLOBIN A1C
Hgb A1c MFr Bld: 5 % (ref 4.8–5.6)
Mean Plasma Glucose: 96.8 mg/dL

## 2018-03-22 LAB — TSH: TSH: 1.185 u[IU]/mL (ref 0.350–4.500)

## 2018-03-22 MED ORDER — VORTIOXETINE HBR 10 MG PO TABS
10.0000 mg | ORAL_TABLET | Freq: Every day | ORAL | Status: DC
  Administered 2018-03-22 – 2018-03-23 (×2): 10 mg via ORAL
  Filled 2018-03-22 (×5): qty 1

## 2018-03-22 MED ORDER — OMEGA-3-ACID ETHYL ESTERS 1 G PO CAPS
1.0000 g | ORAL_CAPSULE | Freq: Two times a day (BID) | ORAL | Status: DC
  Administered 2018-03-22 – 2018-03-24 (×4): 1 g via ORAL
  Filled 2018-03-22 (×8): qty 1

## 2018-03-22 MED ORDER — CLONAZEPAM 0.5 MG PO TABS
1.5000 mg | ORAL_TABLET | Freq: Every day | ORAL | Status: DC
  Administered 2018-03-22: 1.5 mg via ORAL
  Filled 2018-03-22: qty 3

## 2018-03-22 MED ORDER — DIPHENHYDRAMINE HCL 50 MG PO CAPS
50.0000 mg | ORAL_CAPSULE | Freq: Once | ORAL | Status: DC
  Filled 2018-03-22: qty 1

## 2018-03-22 MED ORDER — PRENATAL MULTIVITAMIN CH
1.0000 | ORAL_TABLET | Freq: Two times a day (BID) | ORAL | Status: DC
  Administered 2018-03-22 – 2018-03-23 (×3): 1 via ORAL
  Filled 2018-03-22 (×7): qty 1

## 2018-03-22 MED ORDER — DIPHENHYDRAMINE HCL 25 MG PO CAPS
50.0000 mg | ORAL_CAPSULE | Freq: Four times a day (QID) | ORAL | Status: DC | PRN
  Administered 2018-03-22 – 2018-03-23 (×2): 50 mg via ORAL
  Filled 2018-03-22 (×2): qty 2

## 2018-03-22 MED ORDER — DIPHENHYDRAMINE HCL 25 MG PO CAPS
ORAL_CAPSULE | ORAL | Status: AC
  Filled 2018-03-22: qty 2

## 2018-03-22 MED ORDER — RISPERIDONE 1 MG PO TABS
1.0000 mg | ORAL_TABLET | Freq: Two times a day (BID) | ORAL | Status: DC
  Administered 2018-03-22 (×2): 1 mg via ORAL
  Filled 2018-03-22 (×6): qty 1

## 2018-03-22 MED ORDER — CLONAZEPAM 0.5 MG PO TABS
0.2500 mg | ORAL_TABLET | Freq: Two times a day (BID) | ORAL | Status: DC
  Administered 2018-03-22: 0.25 mg via ORAL
  Filled 2018-03-22 (×3): qty 1

## 2018-03-22 NOTE — Progress Notes (Signed)
Recreation Therapy Notes  Date: 1.15. 20 Time: 0930 Location: 300 Hall Dayroom  Group Topic: Stress Management  Goal Area(s) Addresses:  Patient will identify stress management techniques. Patient will identify benefits of using stress management post d/c.  Behavioral Response: Engaged  Intervention: Stress Management  Activity :  Guided Imagery.  LRT introduced the stress management technique of guided imagery.  LRT read a script that lead the patients on a mental vacation to the beach at sunrise.  Patients were to listen and follow along as LRT read script.  Education:  Stress Management, Discharge Planning.   Education Outcome: Acknowledges Education  Clinical Observations/Feedback:  Pt attended and participated in group.     Avantae Bither, LRT/CTRS         Hektor Huston A 03/22/2018 10:57 AM 

## 2018-03-22 NOTE — Progress Notes (Signed)
Pt came to the nursing station stating he did not feel well after taking the medications, pt wanted to take Melatonin and benadryl, pt was informed he would see the Psychiatrist in the morning and could get it straightened out then. Pt was ordered 1x 50 mg Benadryl per Donell Sievert.

## 2018-03-22 NOTE — Progress Notes (Signed)
Got the Benadryl out pt refused, stated he did not want anything else.

## 2018-03-22 NOTE — Progress Notes (Signed)
Patient ID: Jeffery Johnson, male   DOB: January 05, 1996, 23 y.o.   MRN: [id number]  Nursing Progress Note [id number]  Data: Patient moved off of 500 hall today to 300 hall due to fearfulness and anxiety of peers on the hall. Patient presents paranoid but does engage with Clinical research associate. Patient reported he was conflicted about putting his mother on his consent form due to his paranoia but ultimately decided to provide consent. Patient compliant with scheduled medications. Patient denies pain/physical complaints. Patient is seen attending meals but does isolate to his room. Patient with fair insight at times but at other times is anxious and tearful. Patient currently denies SI/HI/AVH but does appear internally preoccupied. Patient requests 1700 Klonopin to be rescheduled at bedtime due to sedation.   Action: Patient is educated about and provided medication per provider's orders. Patient safety maintained with q15 min safety checks and frequent rounding. Low fall risk precautions in place. Emotional support given. 1:1 interaction and active listening provided. Patient encouraged to attend meals, groups, and work on treatment plan and goals. Labs, vital signs and patient behavior monitored throughout shift.   Response: Patient remains safe on the unit at this time and agrees to come to staff with any issues/concerns. Will continue to support and monitor.

## 2018-03-22 NOTE — Progress Notes (Signed)
D   Pt lethargic when at the medication window   He requested his bedtime medication of clonopin   He was asking questions about his medications and wanted to know why he received Risperdal   Pt was somewhat guarded and has isolated to his room and did not go to group A   Verbal support given   Medications administered and effectiveness monitored   Q 15 min checks   Discussed medications and theruputic uses with patient R    Pt is safe at this time and verbalized understanding
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
## 2018-03-22 NOTE — BHH Suicide Risk Assessment (Signed)
[facility] Admission Suicide Risk Assessment   Nursing information obtained from:  Patient Demographic factors:  Male, Caucasian, Unemployed Current Mental Status:  Suicidal ideation indicated by patient Loss Factors:  NA Historical Factors:  Prior suicide attempts Risk Reduction Factors:  Positive social support  Total Time spent with patient: 30 minutes Principal Problem: Depression recurrent severe, rule out schizophreniform condition Diagnosis:  Active Problems:   Brief psychotic disorder (HCC)  Subjective Data: New onset psychosis in the absence of substance abuse and history of depression  Continued Clinical Symptoms:  Alcohol Use Disorder Identification Test Final Score (AUDIT): 3 The "Alcohol Use Disorders Identification Test", Guidelines for Use in Primary Care, Second Edition.  World Science writer [facility]). Score between 0-7:  no or low risk or alcohol related problems. Score between 8-15:  moderate risk of alcohol related problems. Score between 16-19:  high risk of alcohol related problems. Score 20 or above:  warrants further diagnostic evaluation for alcohol dependence and treatment.   CLINICAL FACTORS:   Depression:   Severe   COGNITIVE FEATURES THAT CONTRIBUTE TO RISK:  None    SUICIDE RISK:   Minimal: No identifiable suicidal ideation.  Patients presenting with no risk factors but with morbid ruminations; may be classified as minimal risk based on the severity of the depressive symptoms  PLAN OF CARE: Neuro protection and antipsychotic therapy as well as antidepressant therapy  I certify that inpatient services furnished can reasonably be expected to improve the patient's condition.   Malvin Johns, MD 03/22/2018, 11:08 AM

## 2018-03-22 NOTE — Progress Notes (Signed)
Patient did not attend the evening speaker NA meeting. Pt was notified that group was beginning but remained in bed.   

## 2018-03-22 NOTE — Progress Notes (Signed)
Pt up in the hallway emotional , stating he wanted to change his goal to trying to get over someone. Pt was speaking about his fiance, pt stated he had trust issues.

## 2018-03-22 NOTE — Therapy (Signed)
Occupational Therapy Group Note  Date:  03/22/2018 Time:  1:04 PM  Group Topic/Focus:  Stress Management  Participation Level:  Active  Participation Quality:  Appropriate  Affect:  Depressed and Flat  Cognitive:  Appropriate  Insight: Improving  Engagement in Group:  Engaged  Modes of Intervention:  Activity, Discussion, Education and Socialization  Additional Comments:    S: "Deep breathing I have heard of but have not really tried"  O: Stress management group completed to use as productive coping strategy, to help mitigate maladaptive coping to integrate in functional BADL/IADL. Stress management tool worksheet discussed to educate on unhealthy vs healthy coping skills to manage stress to improve community integration. Coping strategies taught include: relaxation based- deep breathing, counting to 10, taking a 1 minute vacation, acceptance, stress balls, relaxation audio/video, visual/mental imagery. Positive mental attitude- gratitude, acceptance, cognitive reframing, positive self talk, anger management.  A: Pt quiet and reserved, wanting to learn to practice relaxation strategies to manage stress this date.  P: OT group will continue once per week while pt inpatient.  Dalphine Handing, MSOT, OTR/L Behavioral Health OT/ Acute Relief OT PHP Office: [phone]  Dalphine Handing 03/22/2018, 1:04 PM

## 2018-03-22 NOTE — Progress Notes (Addendum)
CSW attempted to complete PSA with patient. Patient sleeping, did not respond to CSW knocking on door or calling name twice. CSW will attempt to complete PSA at a later time.  Update 1:55pm  CSW met patient in hallway. Patient appears anxious, trembling, requesting to see a nurse, states he is unsure why he is here. Nursing met with patient.  CSW will follow up with patient at a later time, patient reportedly still very anxious.   Stephanie Acre, LCSW-A Clinical Social Worker

## 2018-03-23 MED ORDER — ZONISAMIDE 100 MG PO CAPS
200.0000 mg | ORAL_CAPSULE | Freq: Every day | ORAL | Status: DC
  Administered 2018-03-23: 200 mg via ORAL
  Filled 2018-03-23 (×3): qty 2

## 2018-03-23 MED ORDER — RISPERIDONE 2 MG PO TABS: 2.0000 mg | ORAL_TABLET | Freq: Every day | ORAL | Status: DC

## 2018-03-23 MED ORDER — CLONAZEPAM 0.5 MG PO TABS
0.5000 mg | ORAL_TABLET | Freq: Every day | ORAL | Status: DC
  Administered 2018-03-23: 0.5 mg via ORAL
  Filled 2018-03-23: qty 1

## 2018-03-23 MED ORDER — LORATADINE 10 MG PO TABS
10.0000 mg | ORAL_TABLET | Freq: Every day | ORAL | Status: DC
  Administered 2018-03-23 – 2018-03-24 (×2): 10 mg via ORAL
  Filled 2018-03-23 (×3): qty 1

## 2018-03-23 MED ORDER — RISPERIDONE 1 MG PO TABS
1.0000 mg | ORAL_TABLET | Freq: Every day | ORAL | Status: DC
  Filled 2018-03-23: qty 1

## 2018-03-23 MED ORDER — CLONAZEPAM 1 MG PO TABS: 1.0000 mg | ORAL_TABLET | Freq: Every day | ORAL | Status: DC

## 2018-03-23 MED ORDER — INFLUENZA VAC SPLIT QUAD 0.5 ML IM SUSY
0.5000 mL | PREFILLED_SYRINGE | INTRAMUSCULAR | Status: AC
  Administered 2018-03-24: 0.5 mL via INTRAMUSCULAR
  Filled 2018-03-23: qty 0.5
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
## 2018-03-23 NOTE — Progress Notes (Signed)
The patient rated his day as an 8 out of 10 since he came out of his bedroom and remained awake. He states that he slept for the past two days and is feeling better. His goal for tomorrow is to stick to a routine and remain awake.

## 2018-03-23 NOTE — Progress Notes (Signed)
CSW attempted to complete PSA with patient, patient sleeping due to medications, did not respond to calling name x3 and knocking on door. CSW will follow up with patient in the afternoon.  Enid Cutter, LCSW-A Clinical Social Worker

## 2018-03-23 NOTE — Progress Notes (Signed)
LCSW Group Therapy Note 03/23/2018 2:36 PM  Type of Therapy and Topic: Group Therapy: DBT House  Participation Level: Did Not Attend   Description of Group:  In this group patients will be encouraged to explore  their values, behaviors they want to change, emotions they wish to increase, protective factors, supports, coping skills, and motivational factors. They will be guided to discuss their thoughts, feelings, and behaviors related to these obstacles. The group will be asked to individually process the activity and share their insights with the group. This group will be process-oriented, with patients participating in exploration of their own experiences as well as giving and receiving support and challenge from other group members.   Therapeutic Goals: 1. Patient will identify their values 2. Patient will identify behaviors they wish to modify  3. Patient will identify feelings and emotions they wish to increase 4. Patient will identify strengths, supports, protective factors 5. Patient will identify coping skills 6. Patient will identify goals and motivating factors for change   Summary of Patient Progress Invited, chose not to attend.  Therapeutic Modalities:  Dialectical Behavioral Therapy  Motivational Interviewing Relapse Prevention Therapy 

## 2018-03-23 NOTE — Progress Notes (Signed)
Patient ID: Jeffery Johnson, male   DOB: 1995/11/26, 23 y.o.   MRN: [id number]  Nursing Progress Note [id number]  Data: On initial approach, patient remains in bed asleep. Patient asleep for most of the morning but is seen up on the phone now. Patient educated about medication changes but does likely require more education and reinforcement of information provided. Patient appears less paranoid today but remains flat. Patient is cooperative with staff. Patient currently denies SI/HI/AVH.   Action: Patient is educated about and provided medication per provider's orders. Patient safety maintained with q15 min safety checks and frequent rounding. Low fall risk precautions in place. Emotional support given. 1:1 interaction and active listening provided. Patient encouraged to attend meals, groups, and work on treatment plan and goals. Labs, vital signs and patient behavior monitored throughout shift.   Response: Patient remains safe on the unit at this time and agrees to come to staff with any issues/concerns. Patient is interacting with peers appropriately on the unit. Will continue to support and monitor.

## 2018-03-23 NOTE — [facility] Suicide Risk Assessment (Signed)
[facility] INPATIENT:  Family/Significant Other Suicide Prevention Education  Suicide Prevention Education:  Education Completed; mother, Roque Recalde, [phone] has been identified by the patient as the family member/significant other with whom the patient will be residing, and identified as the person(s) who will aid the patient in the event of a mental health crisis (suicidal ideations/suicide attempt).  With written consent from the patient, the family member/significant other has been provided the following suicide prevention education, prior to the and/or following the discharge of the patient.  The suicide prevention education provided includes the following:  Suicide risk factors  Suicide prevention and interventions  National Suicide Hotline telephone number  [facility] assessment telephone number  [facility] Emergency Assistance 911  [facility] and/or Residential Mobile Crisis Unit telephone number  Request made of family/significant other to:  Remove weapons (e.g., guns, rifles, knives), all items previously/currently identified as safety concern.    Remove drugs/medications (over-the-counter, prescriptions, illicit drugs), all items previously/currently identified as a safety concern.  The family member/significant other verbalizes understanding of the suicide prevention education information provided.  The family member/significant other agrees to remove the items of safety concern listed above.  Mother states she noticed increased paranoid behaviors from patient within the last week, worsening over weekend. She brought patient to pre-established appointment with psychiatrist on Tuesday and psychiatrist recommended patient come to ED for a psych evaluation.  Mother endorses patient experiencing audio and visual hallucinations for approximately one year. States she is concerned his anti-depressant medications are not addressing his worsening mental health and  paranoia.  Mother states there are guns and knives in the home, agrees to remove or relocate them prior to discharge, aware of potential discharge over the weekend.  Mother expressed some concerns for SI and self-harm. Patient has reportedly not engaged in self-injurious behaviors or had a prior suicide attempt, but was reportedly holding a knife prior to admission and told mother he "couldn't go through with it."  Mother is concerned with patient's relationship with fiance. Mother states fiance is controlling and doesn't want patient to attend therapy appointments, or fiance remains present during session.  [name] 03/23/2018, 3:51 PM

## 2018-03-23 NOTE — Progress Notes (Signed)
DAR. Pt has been observed in the dayroom interacting well with peers this evening. Pt did not appear paranoid, but did not want to take his medication until he was able to receive the items brought in by his parent during visitation. Pt stated his day went by so well and looking forward to talking to the in the morning. Pt took his medications with any problems, no unwanted behavior noted or reported. Pt's safety ensured with 15 minute and environmental checks. Pt currently denies SI/HI and A/V hallucinations. Pt verbally agrees to seek staff if SI/HI or A/VH occurs and to consult with staff before acting on any harmful thoughts. Will continue POC.

## 2018-03-23 NOTE — Progress Notes (Signed)
[facility]  03/23/2018 8:52 AM Jeffery EvansCaleb Johnson  MRN:  [id number] Subjective:    Patient reports minimal auditory and visual hallucinations yesterday after his medications got into his system he reports none thus far today however it is early, his sleep was good he was actually sleepy yesterday but of course he was so anxious that we did give him clonazepam and low-dose in addition to low-dose Risperdal.  He is now's sluggish but oriented cooperative without thoughts of harming self or others again paranoia was noted by staff yesterday not thus far today.  No EPS or TD.  Allows me to speak to parents  Principal Problem: New onset psychosis in the context of history of mild psychosis history of recurrent depression history of cannabis use Diagnosis: Active Problems:   Brief psychotic disorder (HCC)  Total Time spent with patient: 20 minutes  Past Medical History:  Past Medical History:  Diagnosis Date  . ADHD (attention deficit hyperactivity disorder)   . Anxiety   . Depression   . Excessive daytime sleepiness 07/05/2014  . Insomnia due to mental condition 07/05/2014  . Migraine   . Seasonal allergies    History reviewed. No pertinent surgical history. Family History:  Family History  Problem Relation Age of Onset  . Breast cancer Mother   . Skin cancer Mother   . Stroke Maternal Grandfather   . Heart disease Maternal Grandfather   . Diabetes Maternal Grandfather   . Stroke Paternal Grandfather   . Heart disease Paternal Grandfather   . Seizures Maternal Grandmother   . Seizures Cousin        mothers side    Social History:  Social History   Substance and Sexual Activity  Alcohol Use Yes  . Alcohol/week: 0.0 standard drinks   Comment: socially     Social History   Substance and Sexual Activity  Drug Use Yes  . Types: Marijuana   Comment: uses every 2-3 months    Social History   Socioeconomic History  . Marital status: Single    Spouse name: Not on file  .  Number of children: Not on file  . Years of education: Not on file  . Highest education level: Not on file  Occupational History  . Not on file  Social Needs  . Financial resource strain: Not on file  . Food insecurity:    Worry: Not on file    Inability: Not on file  . Transportation needs:    Medical: Not on file    Non-medical: Not on file  Tobacco Use  . Smoking status: Light Tobacco Smoker    Types: Cigarettes  . Smokeless tobacco: Never Used  . Tobacco comment: socially  Substance and Sexual Activity  . Alcohol use: Yes    Alcohol/week: 0.0 standard drinks    Comment: socially  . Drug use: Yes    Types: Marijuana    Comment: uses every 2-3 months  . Sexual activity: Not on file  Lifestyle  . Physical activity:    Days per week: Not on file    Minutes per session: Not on file  . Stress: Not on file  Relationships  . Social connections:    Talks on phone: Not on file    Gets together: Not on file    Attends religious service: Not on file    Active member of club or organization: Not on file    Attends meetings of clubs or organizations: Not on file  Relationship status: Not on file  Other Topics Concern  . Not on file  Social History Narrative   Lives home with parents.  Education in college at [employer].  Fiance- Delaney.  Caffeine - 1 every other day.   Additional Social History:                         Sleep: Good  Appetite:  Good  Current Medications: Current Facility-Administered Medications  Medication Dose Route Frequency Provider Last Rate Last Dose  . acetaminophen (TYLENOL) tablet 650 mg  650 mg Oral Q6H PRN Maryagnes AmosStarkes-Perry, Takia S, FNP      . alum & mag hydroxide-simeth (MAALOX/MYLANTA) 200-200-20 MG/5ML suspension 30 mL  30 mL Oral Q4H PRN Rosario AdieStarkes-Perry, Juel Burrowakia S, FNP      . clonazePAM (KLONOPIN) tablet 1 mg  1 mg Oral QHS Malvin JohnsFarah, Palmer Shorey, MD      . diphenhydrAMINE (BENADRYL) capsule 50 mg  50 mg Oral Once Donell SievertSimon, Spencer E, PA-C      .  diphenhydrAMINE (BENADRYL) capsule 50 mg  50 mg Oral Q6H PRN Malvin JohnsFarah, Dragon Thrush, MD   50 mg at 03/22/18 2121  . hydrOXYzine (ATARAX/VISTARIL) tablet 25 mg  25 mg Oral TID PRN Maryagnes AmosStarkes-Perry, Takia S, FNP   25 mg at 03/21/18 2244  . magnesium hydroxide (MILK OF MAGNESIA) suspension 30 mL  30 mL Oral Daily PRN Maryagnes AmosStarkes-Perry, Takia S, FNP      . omega-3 acid ethyl esters (LOVAZA) capsule 1 g  1 g Oral BID Malvin JohnsFarah, Abi Shoults, MD   1 g at 03/22/18 1728  . prenatal multivitamin tablet 1 tablet  1 tablet Oral BID Malvin JohnsFarah, Essica Kiker, MD   1 tablet at 03/22/18 1052  . [START ON 03/24/2018] risperiDONE (RISPERDAL) tablet 2 mg  2 mg Oral QHS Malvin JohnsFarah, Collette Pescador, MD      . vortioxetine HBr (TRINTELLIX) tablet 10 mg  10 mg Oral Daily Malvin JohnsFarah, Ercie Eliasen, MD   10 mg at 03/22/18 1052    Lab Results:  Results for orders placed or performed during the hospital encounter of 03/21/18 (from the past 48 hour(s))  Hemoglobin A1c     Status: None   Collection Time: 03/22/18  6:40 AM  Result Value Ref Range   Hgb A1c MFr Bld 5.0 4.8 - 5.6 %    Comment: (NOTE) Pre diabetes:          5.7%-6.4% Diabetes:              >6.4% Glycemic control for   <7.0% adults with diabetes    Mean Plasma Glucose 96.8 mg/dL    Comment: Performed at [facility], [address] [place], [place] [address]  Lipid panel     Status: Abnormal   Collection Time: 03/22/18  6:40 AM  Result Value Ref Range   Cholesterol 249 (H) 0 - 200 mg/dL   Triglycerides 62 <621<150 mg/dL   HDL 51 >30>40 mg/dL   Total CHOL/HDL Ratio 4.9 RATIO   VLDL 12 0 - 40 mg/dL   LDL Cholesterol 865186 (H) 0 - 99 mg/dL    Comment:        Total Cholesterol/HDL:CHD Risk Coronary Heart Disease Risk Table                     Men   Women  1/2 Average Risk   3.4   3.3  Average Risk       5.0   4.4  2 X  Average Risk   9.6   7.1  3 X Average Risk  23.4   11.0        Use the calculated Patient Ratio above and the CHD Risk Table to determine the patient's CHD Risk.        ATP III CLASSIFICATION  (LDL):  <100     mg/dL   Optimal  161-096  mg/dL   Near or Above                    Optimal  130-159  mg/dL   Borderline  045-409  mg/dL   High  >811     mg/dL   Very High Performed at [facility], [address] [place], [place] [address]   TSH     Status: None   Collection Time: 03/22/18  6:40 AM  Result Value Ref Range   TSH 1.185 0.350 - 4.500 uIU/mL    Comment: Performed by a 3rd Generation assay with a functional sensitivity of <=0.01 uIU/mL. Performed at [facility], [address], [place], [place] [address]     Blood Alcohol level:  Lab Results  Component Value Date   ETH <10 03/21/2018    Metabolic Disorder Labs: Lab Results  Component Value Date   HGBA1C 5.0 03/22/2018   MPG 96.8 03/22/2018   No results found for: PROLACTIN Lab Results  Component Value Date   CHOL 249 (H) 03/22/2018   TRIG 62 03/22/2018   HDL 51 03/22/2018   CHOLHDL 4.9 03/22/2018   VLDL 12 03/22/2018   LDLCALC 186 (H) 03/22/2018    Physical Findings: AIMS: Facial and Oral Movements Muscles of Facial Expression: None, normal Lips and Perioral Area: None, normal Jaw: None, normal Tongue: None, normal,Extremity Movements Upper (arms, wrists, hands, fingers): None, normal Lower (legs, knees, ankles, toes): None, normal, Trunk Movements Neck, shoulders, hips: None, normal, Overall Severity Severity of abnormal movements (highest score from questions above): None, normal Incapacitation due to abnormal movements: None, normal Patient's awareness of abnormal movements (rate only patient's report): No Awareness, Dental Status Current problems with teeth and/or dentures?: No Does patient usually wear dentures?: No  CIWA:    COWS:     Musculoskeletal: Strength & Muscle Tone: within normal limits Gait & Station: normal Patient leans: N/A  Psychiatric Specialty Exam: Physical Exam  ROS  Blood pressure 106/64, pulse 100, temperature 98.3 F (36.8  C), temperature source Oral, resp. rate 18, height 5\' 7"  (1.702 m), weight 77.6 kg.Body mass index is 26.78 kg/m.  General Appearance: Casual  Eye Contact:  Fair  Speech:  Clear and Coherent  Volume:  Decreased  Mood:  Dysphoric  Affect:  Appropriate  Thought Process:  Coherent  Orientation:  Full (Time, Place, and Person)  Thought Content:  Tangential  Suicidal Thoughts:  No  Homicidal Thoughts:  No  Memory:  Immediate;   Good  Judgement:  Good  Insight:  Good  Psychomotor Activity: No involuntary movements  Concentration: Improved from yesterday  Recall:  Good  Fund of Knowledge:  Good  Language:  Good  Akathisia:  Negative  Handed:  Right  AIMS (if indicated):     Assets:  Financial Resources/Insurance Housing Physical Health Resilience Social Support Talents/Skills  ADL's:  Intact  Cognition:  WNL  Sleep:  Number of Hours: 6.75     Treatment Plan Summary: Daily contact with patient to assess and evaluate symptoms and progress in treatment, Medication management and Plan Continue risperidone  to give all doses at bedtime lower clonazepam discontinue daytime dosing continue cognitive and calorie-based therapy continue neuro protective measures  Malvin Johns, MD 03/23/2018, 8:52 AM

## 2018-03-24 MED ORDER — RISPERIDONE 1 MG PO TABS: 1.0000 mg | ORAL_TABLET | Freq: Every day | ORAL | 0 refills | Status: DC

## 2018-03-24 MED ORDER — LORATADINE 10 MG PO TABS: 10.0000 mg | ORAL_TABLET | Freq: Every day | ORAL | Status: DC

## 2018-03-24 MED ORDER — PRENATAL MULTIVITAMIN CH: 1.0000 | ORAL_TABLET | Freq: Two times a day (BID) | ORAL | Status: DC

## 2018-03-24 MED ORDER — OMEGA-3-ACID ETHYL ESTERS 1 G PO CAPS: 1.0000 g | ORAL_CAPSULE | Freq: Two times a day (BID) | ORAL | 0 refills | Status: DC

## 2018-03-24 MED ORDER — ZONISAMIDE 100 MG PO CAPS: 200.0000 mg | ORAL_CAPSULE | Freq: Every day | ORAL | Status: DC

## 2018-03-24 MED ORDER — VORTIOXETINE HBR 10 MG PO TABS: 10.0000 mg | ORAL_TABLET | Freq: Every day | ORAL | 0 refills | Status: DC

## 2018-03-24 MED ORDER — HYDROXYZINE HCL 25 MG PO TABS: 25.0000 mg | ORAL_TABLET | Freq: Three times a day (TID) | ORAL | 0 refills | Status: AC | PRN

## 2018-03-24 MED ORDER — CLONAZEPAM 0.5 MG PO TABS: 0.5000 mg | ORAL_TABLET | Freq: Every day | ORAL | 0 refills | Status: DC

## 2018-03-24 NOTE — Progress Notes (Signed)
Recreation Therapy Notes  Date: 1.17.20 Time: 0930 Location: 300 [address] Dayroom  Group Topic: Stress Management  Goal Area(s) Addresses:  Patient will identify stress management techniques. Patient will identify benefit of using stress management post d/c.  Intervention:  Stress Management  Activity :  Progressive Muscle Relaxation.  LRT introduced the stress management technique of progressive muscle relaxation.  LRT lead the group in tensing and relaxing each muscle individually.  Patients were to follow along as scrip was read to engage in activity.  Education:  Stress Management, Discharge Planning.   Education Outcome: Acknowledges Education  Clinical Observations/Feedback: Pt did not attend group.     Kylii Ennis, LRT/CTRS         Keiosha Cancro A 03/24/2018 11:37 AM 

## 2018-03-24 NOTE — Plan of Care (Signed)
  Problem: Education: Goal: Knowledge of [facility] Education information/materials will improve Outcome: Progressing Goal: Emotional status will improve Outcome: Progressing   Problem: Activity: Goal: Interest or engagement in activities will improve Outcome: Progressing   Problem: Coping: Goal: Ability to verbalize frustrations and anger appropriately will improve Outcome: Progressing

## 2018-03-24 NOTE — [facility] Suicide Risk Assessment (Signed)
[facility] Discharge Suicide Risk Assessment   Principal Problem: New onset psychosis Discharge Diagnoses: Active Problems:   Brief psychotic disorder (HCC)  Alert and oriented reports a resolution in auditory and visual hallucinations, thus far meeting criteria for schizophreniform disorder but hopefully reversed the progression of this condition.  No thoughts of harming self or others contracting fully  Total Time spent with patient: 30 minutes Mental Status Per Nursing Assessment::   On Admission:  Suicidal ideation indicated by patient  Demographic Factors:  Male  Loss Factors: NA  Historical Factors: NA  Risk Reduction Factors:   Positive social support  Continued Clinical Symptoms:  Previous Psychiatric Diagnoses and Treatments  Cognitive Features That Contribute To Risk:  None    Suicide Risk:  Minimal: No identifiable suicidal ideation.  Patients presenting with no risk factors but with morbid ruminations; may be classified as minimal risk based on the severity of the depressive symptoms  Follow-up Information    [name] Follow up.   Why:  Medication management appointment is  Contact information: [address] [place] [address] Phone: [phone] f: (336)        [name] Follow up.   Contact information: [address], [place], [place] [address] Phone: [phone]           Plan Of Care/Follow-up recommendations:  Activity:  full  Ajahnae Rathgeber, MD 03/24/2018, 8:53 AM

## 2018-03-24 NOTE — Progress Notes (Signed)
  [facility] Adult Case Management Discharge Plan :  Will you be returning to the same living situation after discharge:  Yes,  home At discharge, do you have transportation home?: Yes,  mom is picking up at 12pm Do you have the ability to pay for your medications: Yes,  insurance   Release of information consent forms completed and in the chart; letters for school on chart.  CSW unable to confirm appointments with patient's established care providers, patient voices understanding and states he will follow up to schedule appointments. [facility] available for walk in services, should an appointment not be available.  Patient to Follow up at: Follow-up Information    Jeffery Johnson Follow up.   Why:  Please follow up with Dr.Steiner. Contact information: [address], [address] [place] [address] Phone: [phone] f: (336)        Jeffery Johnson Follow up.   Why:  Please follow up with Jeffery Johnson for therapy.  Contact information: [address], [place], [place] [address] Phone: [phone]        [facility] Follow up.   Why:  For therapy and medication management, walk in hours are Monday-Friday 8:00am-3:30pm. Contact information: [address] [place] [address] [phone]           Next level of care provider has access to [facility] Link:no  Safety Planning and Suicide Prevention discussed: Yes,  mom  Have you used any form of tobacco in the last 30 days? (Cigarettes, Smokeless Tobacco, Cigars, and/or Pipes): Yes  Has patient been referred to the Quitline?: Patient refused referral  Patient has been referred for addiction treatment: Yes  Jeffery Johnson, LCSWA 03/24/2018, 9:14 AM

## 2018-03-24 NOTE — Plan of Care (Signed)
  Problem: Education: Goal: Emotional status will improve Outcome: Adequate for Discharge   Problem: Education: Goal: Mental status will improve Outcome: Adequate for Discharge   Problem: Education: Goal: Verbalization of understanding the information provided will improve Outcome: Adequate for Discharge   Problem: Activity: Goal: Interest or engagement in activities will improve Outcome: Adequate for Discharge   

## 2018-03-24 NOTE — Discharge Summary (Signed)
Physician Discharge Summary Note  Patient:  Jeffery Johnson is an 23 y.o., male MRN:  [id number]  DOB:  12-Feb-1996  Patient phone:  [phone] (home)   Patient address:   [address] [place] [address],  Total Time spent with patient: Greater than 30 minutes  Date of Admission:  03/21/2018 Date of Discharge: 03-24-18  Reason for Admission: Worsening hallucinations, suicidal ideations & gestures.  Principal Problem: Brief psychotic disorder [facility])  Discharge Diagnoses: Principal Problem:   Brief psychotic disorder [facility])  Past Psychiatric History: ADHD,anxiety & MDD  Past Medical History:  Past Medical History:  Diagnosis Date  . ADHD (attention deficit hyperactivity disorder)   . Anxiety   . Depression   . Excessive daytime sleepiness 07/05/2014  . Insomnia due to mental condition 07/05/2014  . Migraine   . Seasonal allergies    History reviewed. No pertinent surgical history.  Family History:  Family History  Problem Relation Age of Onset  . Breast cancer Mother   . Skin cancer Mother   . Stroke Maternal Grandfather   . Heart disease Maternal Grandfather   . Diabetes Maternal Grandfather   . Stroke Paternal Grandfather   . Heart disease Paternal Grandfather   . Seizures Maternal Grandmother   . Seizures Cousin        mothers side    Family Psychiatric  History: See H&P Social History:  Social History   Substance and Sexual Activity  Alcohol Use Yes  . Alcohol/week: 0.0 standard drinks   Comment: socially     Social History   Substance and Sexual Activity  Drug Use Yes  . Types: Marijuana   Comment: uses every 2-3 months    Social History   Socioeconomic History  . Marital status: Single    Spouse name: Not on file  . Number of children: Not on file  . Years of education: Not on file  . Highest education level: Not on file  Occupational History  . Not on file  Social Needs  . Financial resource strain: Not on file  . Food insecurity:     Worry: Not on file    Inability: Not on file  . Transportation needs:    Medical: Not on file    Non-medical: Not on file  Tobacco Use  . Smoking status: Light Tobacco Smoker    Types: Cigarettes  . Smokeless tobacco: Never Used  . Tobacco comment: socially  Substance and Sexual Activity  . Alcohol use: Yes    Alcohol/week: 0.0 standard drinks    Comment: socially  . Drug use: Yes    Types: Marijuana    Comment: uses every 2-3 months  . Sexual activity: Not on file  Lifestyle  . Physical activity:    Days per week: Not on file    Minutes per session: Not on file  . Stress: Not on file  Relationships  . Social connections:    Talks on phone: Not on file    Gets together: Not on file    Attends religious service: Not on file    Active member of club or organization: Not on file    Attends meetings of clubs or organizations: Not on file    Relationship status: Not on file  Other Topics Concern  . Not on file  Social History Narrative   Lives home with parents.  Education in college at [employer].  Fiance- Delaney.  Caffeine - 1 every other day.   Hospital Course: (Per Md's admission admission):  This is the first psychiatric admission here or elsewhere for Jeffery Johnson a 23 year old patient who has a history of intermittent cannabis abuse, reporting use about 3 to 4 days out of every 3 months, and his first seizure on 03/22/17 for which he takes Zonegran, and a history of ADHD as well as a history of recurrent depression however this admission is prompted by new onset psychotic symptoms. Patient was actually referred from his psychiatry office due to these new onset psychotic symptoms, even impulses to harm himself, he elaborated to me that he put a knife to his wrist but did not go through with harming himself 2 days prior to coming into the hospital.  He reports auditory hallucinations that sound like his parents talking to him from another room saying routine things. He reports visual  hallucinations seeing dark shadows and silhouettes that he has had intermittently for years but they have exacerbated recently.  He also became more paranoid thinking his girlfriend was unfaithful even though this is not true and this prompted him to check her phone many times. He acknowledges some alcohol use and again intermittent cannabis abuse.  Denies past trauma.  Patient's father apparently has been diagnosed with a bipolar type condition, he had an uncle who committed suicide and the mother has been treated for depression. Patient states he did not sleep well last night because he is not in his own bed and he is a little bit anxious about being on a ward with other individuals who have psychotic disorders 1 of whom was recently admitted and is rather noisy prior to getting his medication-were going to move into a different hallway when we can.  Jeffery Johnson was admitted to the [facility] adult unit for worsening symptoms of psychosis, suicidal thoughts & making suicidal gesture by attempting to cut his wrist, however, unable to go through with it. He presented to  The the hospital seeking treatment for his symptoms.Marland Kitchen He was recommended for mood stabilization treatments.  After evaluation of his presenting symptoms, the medication regimen targeting those symptoms were discussed & initiated with his consent. Jeffery Johnson was involved in making decisions pertaining to his care. He was in agreement in taking & being discharged on the medications as listed below. During his admission assessment, Jeffery Johnson was oriented to the unit and encouraged to participate in the unit programming. He presented other significant pre-existing medical problems that required treatment. He was resumed & discharged on his pertinent home medications for those health issues. He tolerated his treatment regimen without any adverse effects or reactions reported.  During his hospital stay, [name] was evaluated daily by a clinical provider to ascertain his  response to his treatment regimen. As the days go by, improvement was noted as evidenced by his report of decreasing symptoms, improved mood, affect & participation in the unit programming. He was required on daily basis to complete a self-inventory asssessment noting mood, mental status, any new symptoms, anxiety or concerns. His symptoms responded well to his treatment regimen, being in a therapeutic and supportive environment also assisted in his mood stability.   On this day of his hospital discharge, [place] was in much improved condition than upon admission. His symptoms were reported as significantly decreased or resolved completely. Upon discharge, he denies any SI/HI and voiced no AVH. He was motivated to continue taking medications as recommended with a goal of continued improvement in mental health. He is discharged to follow-up care for routine psychiatric care & medication management on an outpatient basis as  noted below. He is provided with all the necessary information needed to make this appointment without problems. He was able to engage in safety planning [facility] or contact emergency services if he feels unable to maintain his own safety or the safety of others. Pt had no further questions, comments or concerns. He left [facility] in no apparent distress with all personal belongings.   Physical Findings: AIMS: Facial and Oral Movements Muscles of Facial Expression: None, normal Lips and Perioral Area: None, normal Jaw: None, normal Tongue: None, normal,Extremity Movements Upper (arms, wrists, hands, fingers): None, normal Lower (legs, knees, ankles, toes): None, normal, Trunk Movements Neck, shoulders, hips: None, normal, Overall Severity Severity of abnormal movements (highest score from questions above): None, normal Incapacitation due to abnormal movements: None, normal Patient's awareness of abnormal movements (rate only patient's report): No Awareness, Dental  Status Current problems with teeth and/or dentures?: No Does patient usually wear dentures?: No  CIWA:    COWS:     Musculoskeletal: Strength & Muscle Tone: within normal limits Gait & Station: normal Patient leans: N/A  Psychiatric Specialty Exam: Physical Exam  Nursing note and vitals reviewed. Constitutional: He appears well-developed.  HENT:  Head: Normocephalic.  Eyes: Pupils are equal, round, and reactive to light.  Neck: Normal range of motion.  Cardiovascular: Normal rate.  Respiratory: Effort normal.  GI: Soft.  Genitourinary:    Genitourinary Comments: Deferred   Musculoskeletal: Normal range of motion.  Neurological: He is alert.  Skin: Skin is warm.    Review of Systems  Constitutional: Negative.   HENT: Negative.   Eyes: Negative.   Respiratory: Negative.  Negative for cough and shortness of breath.   Cardiovascular: Negative.  Negative for chest pain and palpitations.  Gastrointestinal: Negative.  Negative for abdominal pain, heartburn, nausea and vomiting.  Genitourinary: Negative.   Musculoskeletal: Negative.   Skin: Negative.   Neurological: Negative.  Negative for dizziness and headaches.  Psychiatric/Behavioral: Positive for depression (Stabilized with medication prior to discharge). Negative for hallucinations, memory loss, substance abuse and suicidal ideas. The patient has insomnia (Stabilized with medication prior to discharge). The patient is not nervous/anxious (Stable).     Blood pressure 137/84, pulse 94, temperature 98.4 F (36.9 C), temperature source Oral, resp. rate 18, height 5\' 7"  (1.702 m), weight 77.6 kg.Body mass index is 26.78 kg/m.  See Md's discharge SRA   Have you used any form of tobacco in the last 30 days? (Cigarettes, Smokeless Tobacco, Cigars, and/or Pipes): Yes  Has this patient used any form of tobacco in the last 30 days? (Cigarettes, Smokeless Tobacco, Cigars, and/or Pipes): Yes, an FDA-approved tobacco cessation  medication was offered at discharge.  Blood Alcohol level:  Lab Results  Component Value Date   ETH <10 03/21/2018   Metabolic Disorder Labs:  Lab Results  Component Value Date   HGBA1C 5.0 03/22/2018   MPG 96.8 03/22/2018   No results found for: PROLACTIN Lab Results  Component Value Date   CHOL 249 (H) 03/22/2018   TRIG 62 03/22/2018   HDL 51 03/22/2018   CHOLHDL 4.9 03/22/2018   VLDL 12 03/22/2018   LDLCALC 186 (H) 03/22/2018   See Psychiatric Specialty Exam and Suicide Risk Assessment completed by Attending Physician prior to discharge.  Discharge destination:  Home  Is patient on multiple antipsychotic therapies at discharge:  No   Has Patient had three or more failed trials of antipsychotic monotherapy by history:  No  Recommended Plan for Multiple  Antipsychotic Therapies: NA Discharge Instructions    Discharge instructions   Complete by:  As directed    Patient is instructed prior to discharge to: Take all medications as prescribed by his/her mental healthcare provider. Report any adverse effects and or reactions from the medicines to his/her outpatient provider promptly. Patient has been instructed & cautioned: To not engage in alcohol and or illegal drug use while on prescription medicines. In the event of worsening symptoms, patient is instructed to call the crisis hotline, 911 and or go to the nearest ED for appropriate evaluation and treatment of symptoms. To follow-up with his/her primary care provider for your other medical issues, concerns and or health care needs.     Allergies as of 03/24/2018   No Known Allergies     Medication List    STOP taking these medications   busPIRone 30 MG tablet Commonly known as:  BUSPAR   cetirizine 10 MG tablet Commonly known as:  ZYRTEC   diphenhydrAMINE 25 MG tablet Commonly known as:  BENADRYL   escitalopram 10 MG tablet Commonly known as:  LEXAPRO   escitalopram 20 MG tablet Commonly known as:  LEXAPRO    Melatonin 10 MG Tabs   montelukast 10 MG tablet Commonly known as:  SINGULAIR   omeprazole 20 MG capsule Commonly known as:  PRILOSEC   VYVANSE 40 MG capsule Generic drug:  lisdexamfetamine   ZOLMitriptan 2.5 MG tablet Commonly known as:  ZOMIG     TAKE these medications     Indication  clonazePAM 0.5 MG tablet Commonly known as:  KLONOPIN Take 1 tablet (0.5 mg total) by mouth at bedtime. For sleep  Indication:  Sleep   hydrOXYzine 25 MG tablet Commonly known as:  ATARAX/VISTARIL Take 1 tablet (25 mg total) by mouth 3 (three) times daily as needed for anxiety.  Indication:  Feeling Anxious   loratadine 10 MG tablet Commonly known as:  CLARITIN Take 1 tablet (10 mg total) by mouth daily. (May buy from over the counter): For smoking cessaion Start taking on:  March 25, 2018  Indication:  Perennial Allergic Rhinitis, Hayfever   omega-3 acid ethyl esters 1 g capsule Commonly known as:  LOVAZA Take 1 capsule (1 g total) by mouth 2 (two) times daily. For hyperglycemia  Indication:  High Amount of Triglycerides in the Blood   prenatal multivitamin Tabs tablet Take 1 tablet by mouth 2 (two) times daily. (May buy from over the counter): Vitamin supplement  Indication:  Vitamin Deficiency   risperiDONE 1 MG tablet Commonly known as:  RISPERDAL Take 1 tablet (1 mg total) by mouth at bedtime. For mood control  Indication:  Mood control   vortioxetine HBr 10 MG Tabs tablet Commonly known as:  TRINTELLIX Take 1 tablet (10 mg total) by mouth daily. For depression  Indication:  Major Depressive Disorder   zonisamide 100 MG capsule Commonly known as:  ZONEGRAN Take 2 capsules (200 mg total) by mouth at bedtime. For antipsychotic induced weight gain What changed:    how much to take  additional instructions  Indication:  Antipsychotic Therapy-Induced Weight Gain      Follow-up Information    [name] Follow up.   Why:  Please follow up with Dr.Steiner. Contact  information: [address] [place] [address] Phone: [phone] f: (336)        [name] Follow up.   Why:  Please follow up with [name] for therapy.  Contact information: [address], [place], [place] [address] Phone: [phone]        [facility] Follow up.   Why:  For therapy and medication management, walk in hours are Monday-Friday 8:00am-3:30pm. Contact information: [address] [place] [address] [phone]          Follow-up recommendations:  Activity:  As tolerated Diet: As recommended by your primary care doctor. Keep all scheduled follow-up appointments as recommended.  Comments: Patient is instructed prior to discharge to: Take all medications as prescribed by his/her mental healthcare provider. Report any adverse effects and or reactions from the medicines to his/her outpatient provider promptly. Patient has been instructed & cautioned: To not engage in alcohol and or illegal drug use while on prescription medicines. In the event of worsening symptoms, patient is instructed to call the crisis hotline, 911 and or go to the nearest ED for appropriate evaluation and treatment of symptoms. To follow-up with his/her primary care provider for your other medical issues, concerns and or health care needs.   Signed: Armandina StammerAgnes Naelani Lafrance, NP, PMHNP, FNP-BC 03/24/2018, 11:39 AM

## 2018-03-24 NOTE — Progress Notes (Signed)
Patient ID: F…Jeffery Johnson, male   DOB: Sep 18, 1995, 23 y.o.   MRN: [id number]  Pt discharged to lobby. Pt was stable and appreciative at that time. All papers and prescriptions were given and valuables returned. Verbal understanding expressed. Denies SI/HI and A/VH. Pt given opportunity to express concerns and ask questions.

## 2018-03-29 DIAGNOSIS — K600 Acute anal fissure: Secondary | ICD-10-CM | POA: Diagnosis not present

## 2018-03-29 DIAGNOSIS — K625 Hemorrhage of anus and rectum: Secondary | ICD-10-CM | POA: Diagnosis not present

## 2018-03-30 DIAGNOSIS — F341 Dysthymic disorder: Secondary | ICD-10-CM | POA: Diagnosis not present

## 2018-04-03 DIAGNOSIS — F341 Dysthymic disorder: Secondary | ICD-10-CM | POA: Diagnosis not present

## 2018-04-07 DIAGNOSIS — F332 Major depressive disorder, recurrent severe without psychotic features: Secondary | ICD-10-CM | POA: Diagnosis not present

## 2018-04-07 DIAGNOSIS — F900 Attention-deficit hyperactivity disorder, predominantly inattentive type: Secondary | ICD-10-CM | POA: Diagnosis not present

## 2018-04-07 DIAGNOSIS — F23 Brief psychotic disorder: Secondary | ICD-10-CM | POA: Diagnosis not present

## 2018-04-07 DIAGNOSIS — F411 Generalized anxiety disorder: Secondary | ICD-10-CM | POA: Diagnosis not present

## 2018-04-11 DIAGNOSIS — R102 Pelvic and perineal pain: Secondary | ICD-10-CM | POA: Diagnosis not present

## 2018-04-13 DIAGNOSIS — F341 Dysthymic disorder: Secondary | ICD-10-CM | POA: Diagnosis not present

## 2018-04-28 DIAGNOSIS — F341 Dysthymic disorder: Secondary | ICD-10-CM | POA: Diagnosis not present

## 2018-05-04 DIAGNOSIS — F900 Attention-deficit hyperactivity disorder, predominantly inattentive type: Secondary | ICD-10-CM | POA: Diagnosis not present

## 2018-05-04 DIAGNOSIS — F341 Dysthymic disorder: Secondary | ICD-10-CM | POA: Diagnosis not present

## 2018-05-04 DIAGNOSIS — F411 Generalized anxiety disorder: Secondary | ICD-10-CM | POA: Diagnosis not present

## 2018-05-04 DIAGNOSIS — F4323 Adjustment disorder with mixed anxiety and depressed mood: Secondary | ICD-10-CM | POA: Diagnosis not present

## 2018-05-04 DIAGNOSIS — F23 Brief psychotic disorder: Secondary | ICD-10-CM | POA: Diagnosis not present

## 2018-05-18 DIAGNOSIS — F341 Dysthymic disorder: Secondary | ICD-10-CM | POA: Diagnosis not present

## 2018-05-30 ENCOUNTER — Telehealth: Payer: Self-pay | Admitting: *Deleted

## 2018-05-30 NOTE — Telephone Encounter (Signed)
LVM advising patient we are severely limiting in person visits due to COVID 19. Advised his FU tomorrow needs to be changed to phone visit. Requested he call back to confirm this change.

## 2018-05-30 NOTE — Telephone Encounter (Signed)
Patient called in and stated phone visit is ok

## 2018-05-31 ENCOUNTER — Ambulatory Visit: Payer: Federal, State, Local not specified - PPO | Admitting: Diagnostic Neuroimaging

## 2018-05-31 ENCOUNTER — Encounter: Payer: Self-pay | Admitting: *Deleted

## 2018-05-31 ENCOUNTER — Other Ambulatory Visit: Payer: Self-pay

## 2018-05-31 NOTE — Telephone Encounter (Signed)
Patient returned call and consented to [facility] video appt tomorrow afternoon. Medical/surgical hx, meds, allergies, etc reviewed. Advised him he will receive e mail with link for webex meeting.   Advised we'll take all precautions to reduce any security or privacy concerns. This will be treated like an office visit, and we will file with your insurance. There may be a patient responsible charge related to this service.  He verbalized understanding.

## 2018-05-31 NOTE — Telephone Encounter (Signed)
LVM requesting call back to discuss converting phone visit to video visit tomorrow.

## 2018-06-01 ENCOUNTER — Ambulatory Visit (INDEPENDENT_AMBULATORY_CARE_PROVIDER_SITE_OTHER): Payer: Federal, State, Local not specified - PPO | Admitting: Diagnostic Neuroimaging

## 2018-06-01 ENCOUNTER — Other Ambulatory Visit: Payer: Self-pay

## 2018-06-01 DIAGNOSIS — R569 Unspecified convulsions: Secondary | ICD-10-CM | POA: Diagnosis not present

## 2018-06-01 NOTE — Progress Notes (Signed)
[facility]  PATIENT: Jeffery Johnson DOB: 05/30/1995  REFERRING CLINICIAN: ER  HISTORY FROM: patient (via video) REASON FOR VISIT: follow up   HISTORICAL  CHIEF COMPLAINT:  No chief complaint on file.   HISTORY OF PRESENT ILLNESS:   UPDATE (06/01/18, VRP): Since last visit, doing well. No further spells or jerking movements or seizures. Symptoms are resolved,   PRIOR HPI (01/24/18): 23 year old male here for evaluation of seizure.  01/18/18 patient was with some friends inside of their garage, had 2 beers and some cannabis, near an electric heater and some blankets, when all of a sudden he stood up and walked outside.  His fiance went out to check on him and he was acting strangely.  Then all of a sudden his eyes glazed over and he started to fall to the ground.  His left hand was stiff and his right hand was picking at his own clothing.  He was shaky and jittery.  This lasted for 10 minutes.  No tongue biting or incontinence.  Afterwards he was confused, started to wake up unable to speak.  He stated that the rest of the night continuously talking.  He was having intermittent whole body muscle jerks for the next few days.  On 01/20/18 patient had nosebleed continued intermittent whole body muscle jerks, and therefore went to the emergency room for evaluation.  CT scan and lab testing were unremarkable.  Patient was referred to neurology clinic for further evaluation.  No other prodromal triggering factors.  No recent infections, change in medicine, change in sleep.  He has been under more stress lately related to college exams.  He has been on stable anti-depressant medication and antianxiety medication.  He takes some attention deficit disorder medication, stable doses for past 6 months.   REVIEW OF SYSTEMS: Full 14 system review of systems performed and negative with exception of: only as per HPI.    ALLERGIES: No Known Allergies  HOME MEDICATIONS: Outpatient  Medications Prior to Visit  Medication Sig Dispense Refill  . clonazePAM (KLONOPIN) 0.5 MG tablet Take 1 tablet (0.5 mg total) by mouth at bedtime. For sleep 7 tablet 0  . hydrOXYzine (ATARAX/VISTARIL) 25 MG tablet Take 1 tablet (25 mg total) by mouth 3 (three) times daily as needed for anxiety. 60 tablet 0  . loratadine (CLARITIN) 10 MG tablet Take 1 tablet (10 mg total) by mouth daily. (May buy from over the counter): For smoking cessaion    . omega-3 acid ethyl esters (LOVAZA) 1 g capsule Take 1 capsule (1 g total) by mouth 2 (two) times daily. For hyperglycemia (Patient not taking: Reported on 05/31/2018) 60 capsule 0  . Prenatal Vit-Fe Fumarate-FA (PRENATAL MULTIVITAMIN) TABS tablet Take 1 tablet by mouth 2 (two) times daily. (May buy from over the counter): Vitamin supplement (Patient not taking: Reported on 05/31/2018)    . risperiDONE (RISPERDAL) 1 MG tablet Take 1 tablet (1 mg total) by mouth at bedtime. For mood control 30 tablet 0  . vortioxetine HBr (TRINTELLIX) 10 MG TABS tablet Take 1 tablet (10 mg total) by mouth daily. For depression 30 tablet 0  . ZOMIG 5 MG nasal solution as needed.    . zonisamide (ZONEGRAN) 100 MG capsule Take 2 capsules (200 mg total) by mouth at bedtime. For antipsychotic induced weight gain     No facility-administered medications prior to visit.     PAST MEDICAL HISTORY: Past Medical History:  Diagnosis Date  . ADHD (attention deficit hyperactivity disorder)   .  Anxiety   . Depression   . Excessive daytime sleepiness 07/05/2014  . Insomnia due to mental condition 07/05/2014  . Migraine   . Seasonal allergies     PAST SURGICAL HISTORY: No past surgical history on file.  FAMILY HISTORY: Family History  Problem Relation Age of Onset  . Breast cancer Mother   . Skin cancer Mother   . Stroke Maternal Grandfather   . Heart disease Maternal Grandfather   . Diabetes Maternal Grandfather   . Stroke Paternal Grandfather   . Heart disease Paternal  Grandfather   . Seizures Maternal Grandmother   . Seizures Cousin        mothers side     SOCIAL HISTORY: Social History   Socioeconomic History  . Marital status: Single    Spouse name: Not on file  . Number of children: Not on file  . Years of education: Not on file  . Highest education level: Not on file  Occupational History  . Not on file  Social Needs  . Financial resource strain: Not on file  . Food insecurity:    Worry: Not on file    Inability: Not on file  . Transportation needs:    Medical: Not on file    Non-medical: Not on file  Tobacco Use  . Smoking status: Former Smoker    Types: Cigarettes  . Smokeless tobacco: Never Used  . Tobacco comment: socially  Substance and Sexual Activity  . Alcohol use: Yes    Alcohol/week: 0.0 standard drinks    Comment: socially  . Drug use: Not Currently    Types: Marijuana    Comment: uses every 2-3 months, stopped 01/2018  . Sexual activity: Not on file  Lifestyle  . Physical activity:    Days per week: Not on file    Minutes per session: Not on file  . Stress: Not on file  Relationships  . Social connections:    Talks on phone: Not on file    Gets together: Not on file    Attends religious service: Not on file    Active member of club or organization: Not on file    Attends meetings of clubs or organizations: Not on file    Relationship status: Not on file  . Intimate partner violence:    Fear of current or ex partner: Not on file    Emotionally abused: Not on file    Physically abused: Not on file    Forced sexual activity: Not on file  Other Topics Concern  . Not on file  Social History Narrative   Lives home with parents.  Education in college at [employer].  Fiance- Delaney.  Caffeine - 1 every other day.     PHYSICAL EXAM  GENERAL EXAM/CONSTITUTIONAL: Vitals:  There were no vitals filed for this visit. There is no height or weight on file to calculate BMI. Wt Readings from Last 3 Encounters:   03/21/18 178 lb (80.7 kg)  01/24/18 174 lb 6.4 oz (79.1 kg)  01/20/18 180 lb (81.6 kg)    Patient is in no distress; well developed, nourished and groomed; neck is supple  NEUROLOGIC: MENTAL STATUS:  No flowsheet data found.  awake, alert, oriented to person, place and time  recent and remote memory intact  normal attention and concentration  language fluent, comprehension intact, naming intact  fund of knowledge appropriate  CRANIAL NERVE:   FACE SYMM  NO DYSARTHRIA      DIAGNOSTIC DATA (LABS, IMAGING, TESTING) -  I reviewed patient records, labs, notes, testing and imaging myself where available.  Lab Results  Component Value Date   WBC 7.7 03/21/2018   HGB 15.5 03/21/2018   HCT 45.8 03/21/2018   MCV 90.7 03/21/2018   PLT 298 03/21/2018      Component Value Date/Time   NA 140 03/21/2018 1538   K 3.4 (L) 03/21/2018 1538   CL 108 03/21/2018 1538   CO2 24 03/21/2018 1538   GLUCOSE 102 (H) 03/21/2018 1538   BUN 11 03/21/2018 1538   CREATININE 0.82 03/21/2018 1538   CALCIUM 9.2 03/21/2018 1538   PROT 7.8 03/21/2018 1538   ALBUMIN 5.0 03/21/2018 1538   AST 18 03/21/2018 1538   ALT 33 03/21/2018 1538   ALKPHOS 43 03/21/2018 1538   BILITOT 1.8 (H) 03/21/2018 1538   GFRNONAA >60 03/21/2018 1538   GFRAA >60 03/21/2018 1538   Lab Results  Component Value Date   CHOL 249 (H) 03/22/2018   HDL 51 03/22/2018   LDLCALC 186 (H) 03/22/2018   TRIG 62 03/22/2018   CHOLHDL 4.9 03/22/2018   Lab Results  Component Value Date   HGBA1C 5.0 03/22/2018   No results found for: VITAMINB12 Lab Results  Component Value Date   TSH 1.185 03/22/2018    01/20/18 Opiates NONE DETECTED NONE DETECTED   Cocaine NONE DETECTED NONE DETECTED   Benzodiazepines NONE DETECTED NONE DETECTED   Amphetamines NONE DETECTED NONE DETECTED   Tetrahydrocannabinol NONE DETECTED NONE DETECTED   Barbiturates NONE DETECTED NONE DETECTED     01/20/18 CT head [I reviewed images myself  and agree with interpretation. -VRP]  - negative   01/31/18 MRI brain  - normal  02/22/18 EEG - normal   ASSESSMENT AND PLAN  23 y.o. year old male here with new onset seizure on 01/18/2018, with features suggesting complex partial seizure with secondary generalization. MRI brain and EEG normal.   Dx:  1. New onset seizure (HCC)      Virtual Visit via Video Note  I connected with Shea Evans on 06/01/18 at  2:30 PM EDT by a video enabled telemedicine application and verified that I am speaking with the correct person using two identifiers.   I discussed the limitations of evaluation and management by telemedicine and the availability of in person appointments. The patient expressed understanding and agreed to proceed.   I discussed the assessment and treatment plan with the patient. The patient was provided an opportunity to ask questions and all were answered. The patient agreed with the plan and demonstrated an understanding of the instructions.   The patient was advised to call back or seek an in-person evaluation if the symptoms worsen or if the condition fails to improve as anticipated.  I provided 15 minutes of non-face-to-face time during this encounter.   ABNORMAL SPELLS (resolved)  - Request psychiatrist to review medications, and consider reduction in buspirone and vyvanse (which may lower seizure threshold)  - According to [place] law, you can not drive unless you are seizure / syncope free for at least 6 months and under physician's care.   - Please maintain precautions. Do not participate in activities where a loss of awareness could harm you or someone else. No swimming alone, no tub bathing, no hot tubs, no driving, no operating motorized vehicles (cars, ATVs, motocycles, etc), lawnmowers, power tools or firearms. No standing at heights, such as rooftops, ladders or stairs. Avoid hot objects such as stoves, heaters, open fires. Wear a helmet when riding  a bicycle,  scooter, skateboard, etc. and avoid areas of traffic. Set your water heater to 120 degrees or less.  Return for pending if symptoms worsen or fail to improve.    Suanne Marker, MD 06/01/2018, 3:16 PM Certified in Neurology, Neurophysiology and Neuroimaging  [facility] [address] [place], [place] [address] [phone]

## 2018-06-08 DIAGNOSIS — F23 Brief psychotic disorder: Secondary | ICD-10-CM | POA: Diagnosis not present

## 2018-06-08 DIAGNOSIS — F411 Generalized anxiety disorder: Secondary | ICD-10-CM | POA: Diagnosis not present

## 2018-06-08 DIAGNOSIS — F900 Attention-deficit hyperactivity disorder, predominantly inattentive type: Secondary | ICD-10-CM | POA: Diagnosis not present

## 2018-06-08 DIAGNOSIS — F341 Dysthymic disorder: Secondary | ICD-10-CM | POA: Diagnosis not present

## 2018-06-08 DIAGNOSIS — F332 Major depressive disorder, recurrent severe without psychotic features: Secondary | ICD-10-CM | POA: Diagnosis not present

## 2018-06-22 DIAGNOSIS — F341 Dysthymic disorder: Secondary | ICD-10-CM | POA: Diagnosis not present

## 2018-07-11 DIAGNOSIS — F341 Dysthymic disorder: Secondary | ICD-10-CM | POA: Diagnosis not present

## 2018-07-13 DIAGNOSIS — F3341 Major depressive disorder, recurrent, in partial remission: Secondary | ICD-10-CM | POA: Diagnosis not present

## 2018-07-13 DIAGNOSIS — F900 Attention-deficit hyperactivity disorder, predominantly inattentive type: Secondary | ICD-10-CM | POA: Diagnosis not present

## 2018-07-13 DIAGNOSIS — F23 Brief psychotic disorder: Secondary | ICD-10-CM | POA: Diagnosis not present

## 2018-07-13 DIAGNOSIS — F411 Generalized anxiety disorder: Secondary | ICD-10-CM | POA: Diagnosis not present

## 2018-07-27 DIAGNOSIS — D802 Selective deficiency of immunoglobulin A [IgA]: Secondary | ICD-10-CM | POA: Diagnosis not present

## 2018-07-27 DIAGNOSIS — J3089 Other allergic rhinitis: Secondary | ICD-10-CM | POA: Diagnosis not present

## 2018-07-27 DIAGNOSIS — J4520 Mild intermittent asthma, uncomplicated: Secondary | ICD-10-CM | POA: Diagnosis not present

## 2018-07-27 DIAGNOSIS — J301 Allergic rhinitis due to pollen: Secondary | ICD-10-CM | POA: Diagnosis not present

## 2018-08-08 DIAGNOSIS — F341 Dysthymic disorder: Secondary | ICD-10-CM | POA: Diagnosis not present

## 2018-08-29 DIAGNOSIS — F900 Attention-deficit hyperactivity disorder, predominantly inattentive type: Secondary | ICD-10-CM | POA: Diagnosis not present

## 2018-08-29 DIAGNOSIS — G4700 Insomnia, unspecified: Secondary | ICD-10-CM | POA: Diagnosis not present

## 2018-08-29 DIAGNOSIS — F23 Brief psychotic disorder: Secondary | ICD-10-CM | POA: Diagnosis not present

## 2018-09-05 DIAGNOSIS — F341 Dysthymic disorder: Secondary | ICD-10-CM | POA: Diagnosis not present

## 2018-09-09 IMAGING — CR DG RIBS W/ CHEST 3+V*L*
3 series · 3 of 3 positions shown · non-contrast
Comparison: 06/30/2009

CLINICAL DATA: Left anterior chest pain for several years, no known
injury, initial encounter

EXAM:
LEFT RIBS AND CHEST - 3+ VIEW

[w ribs ap/pa upper left]
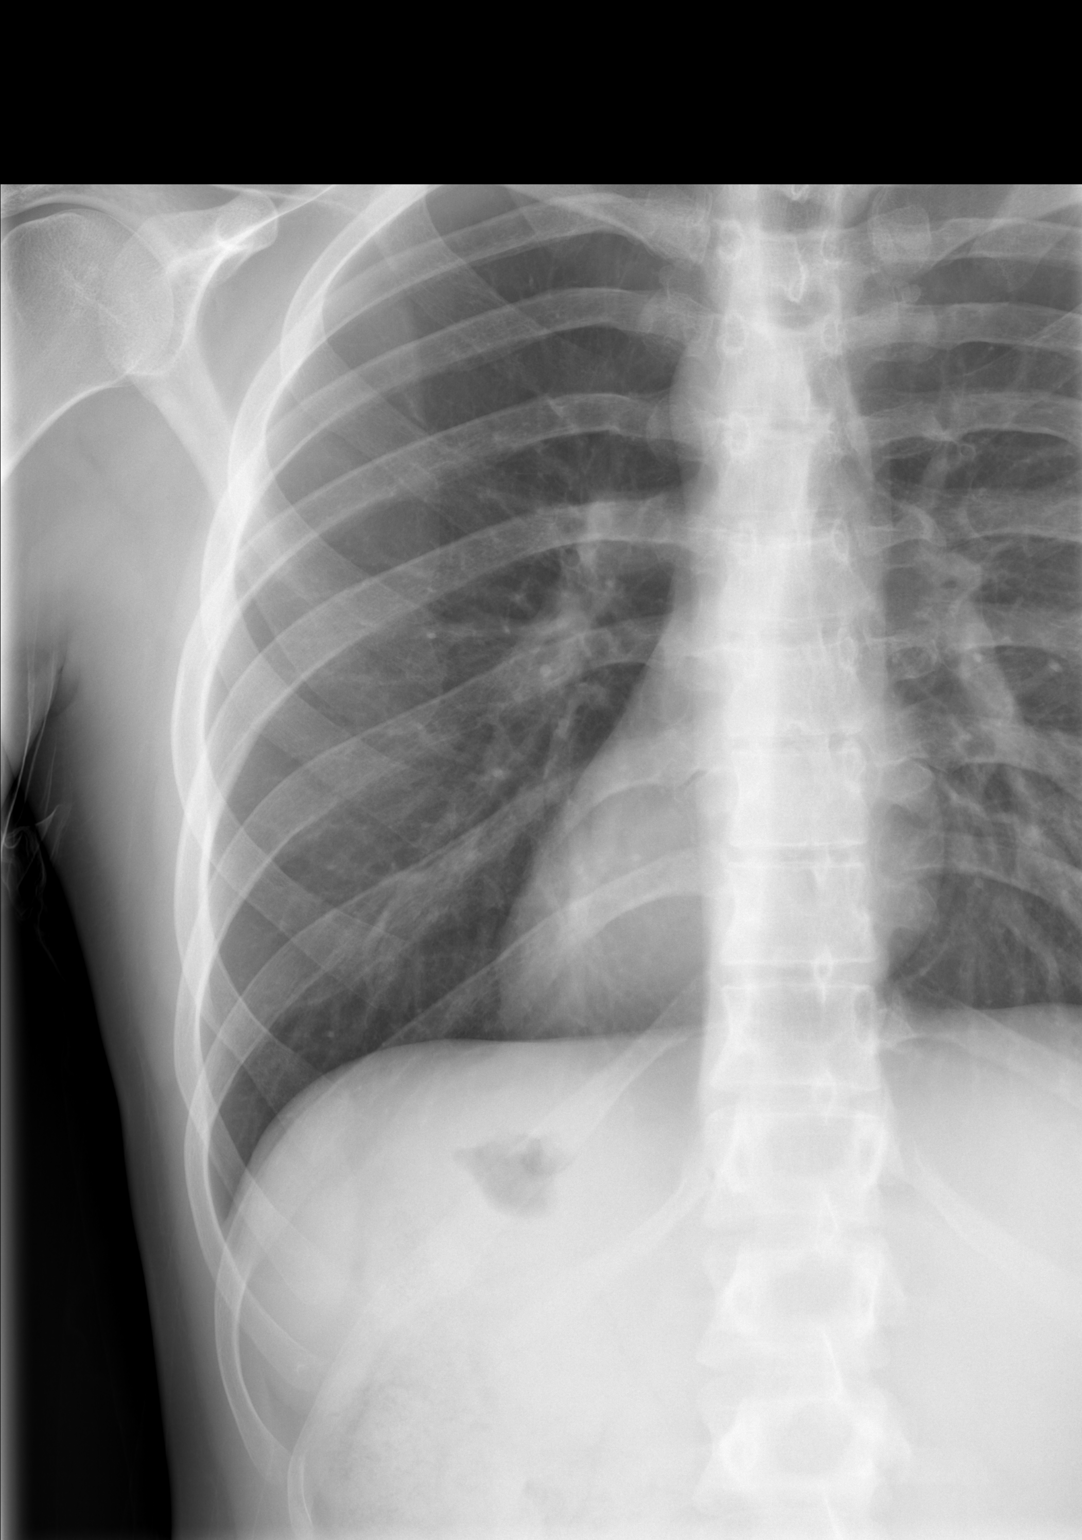

[w ribs oblique left]
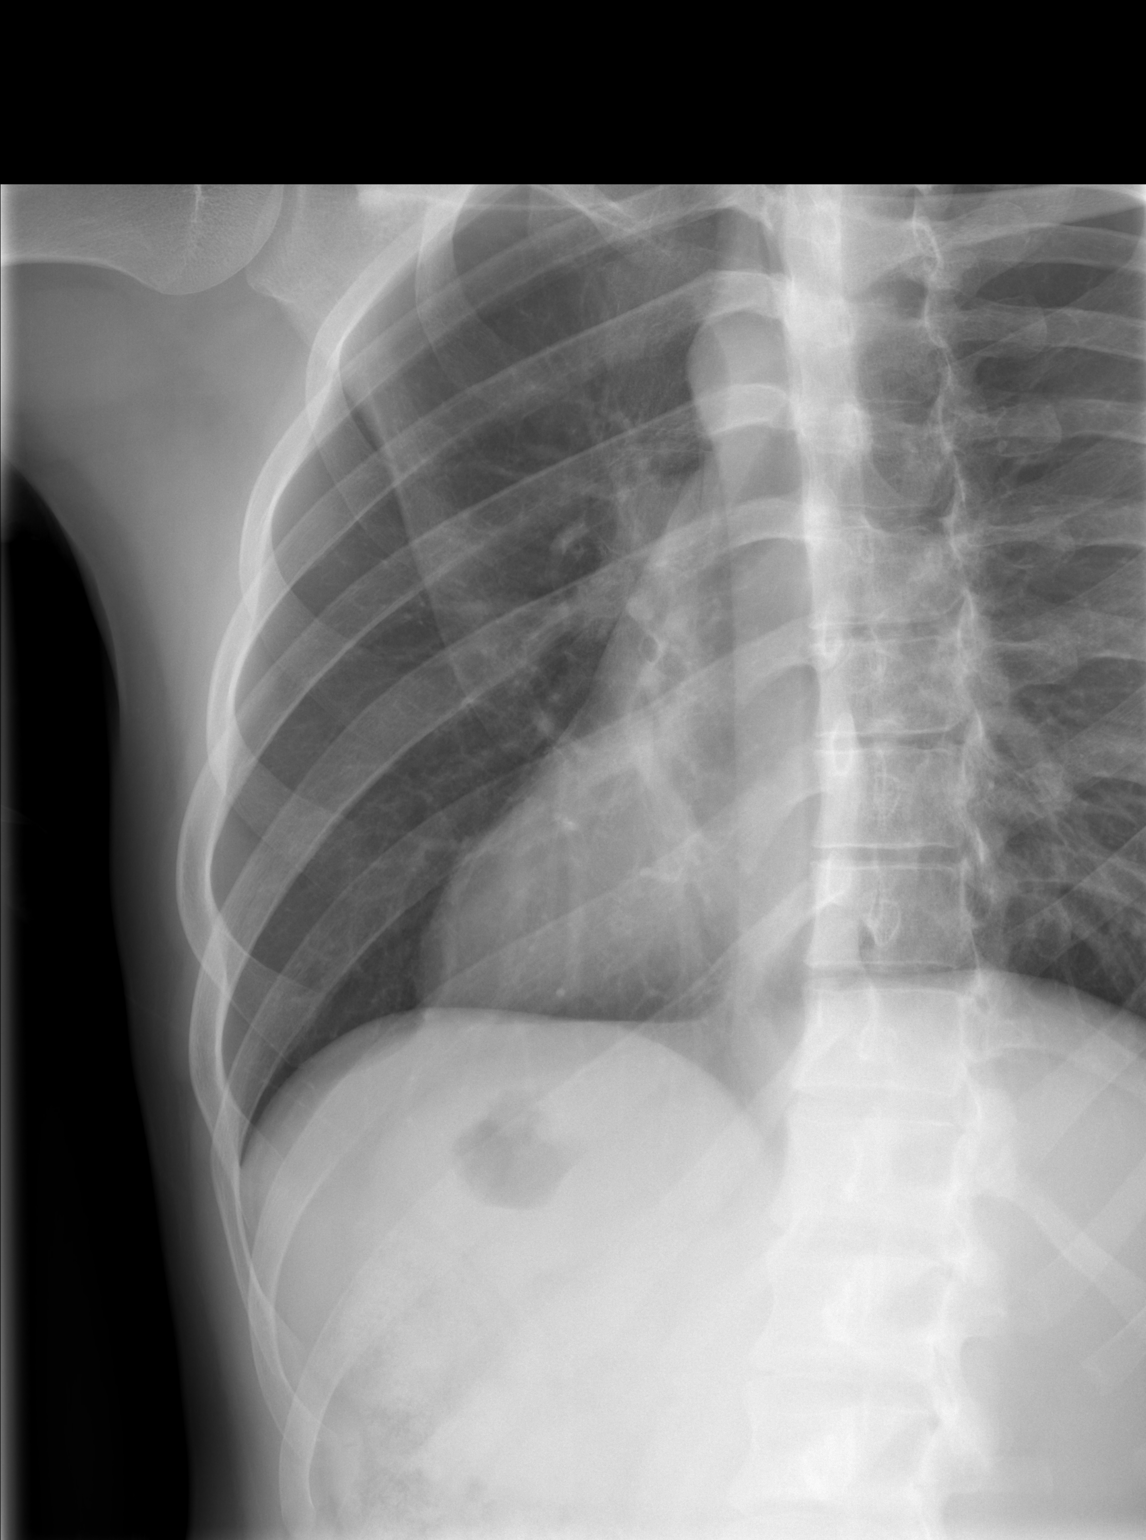

[w chest pa]
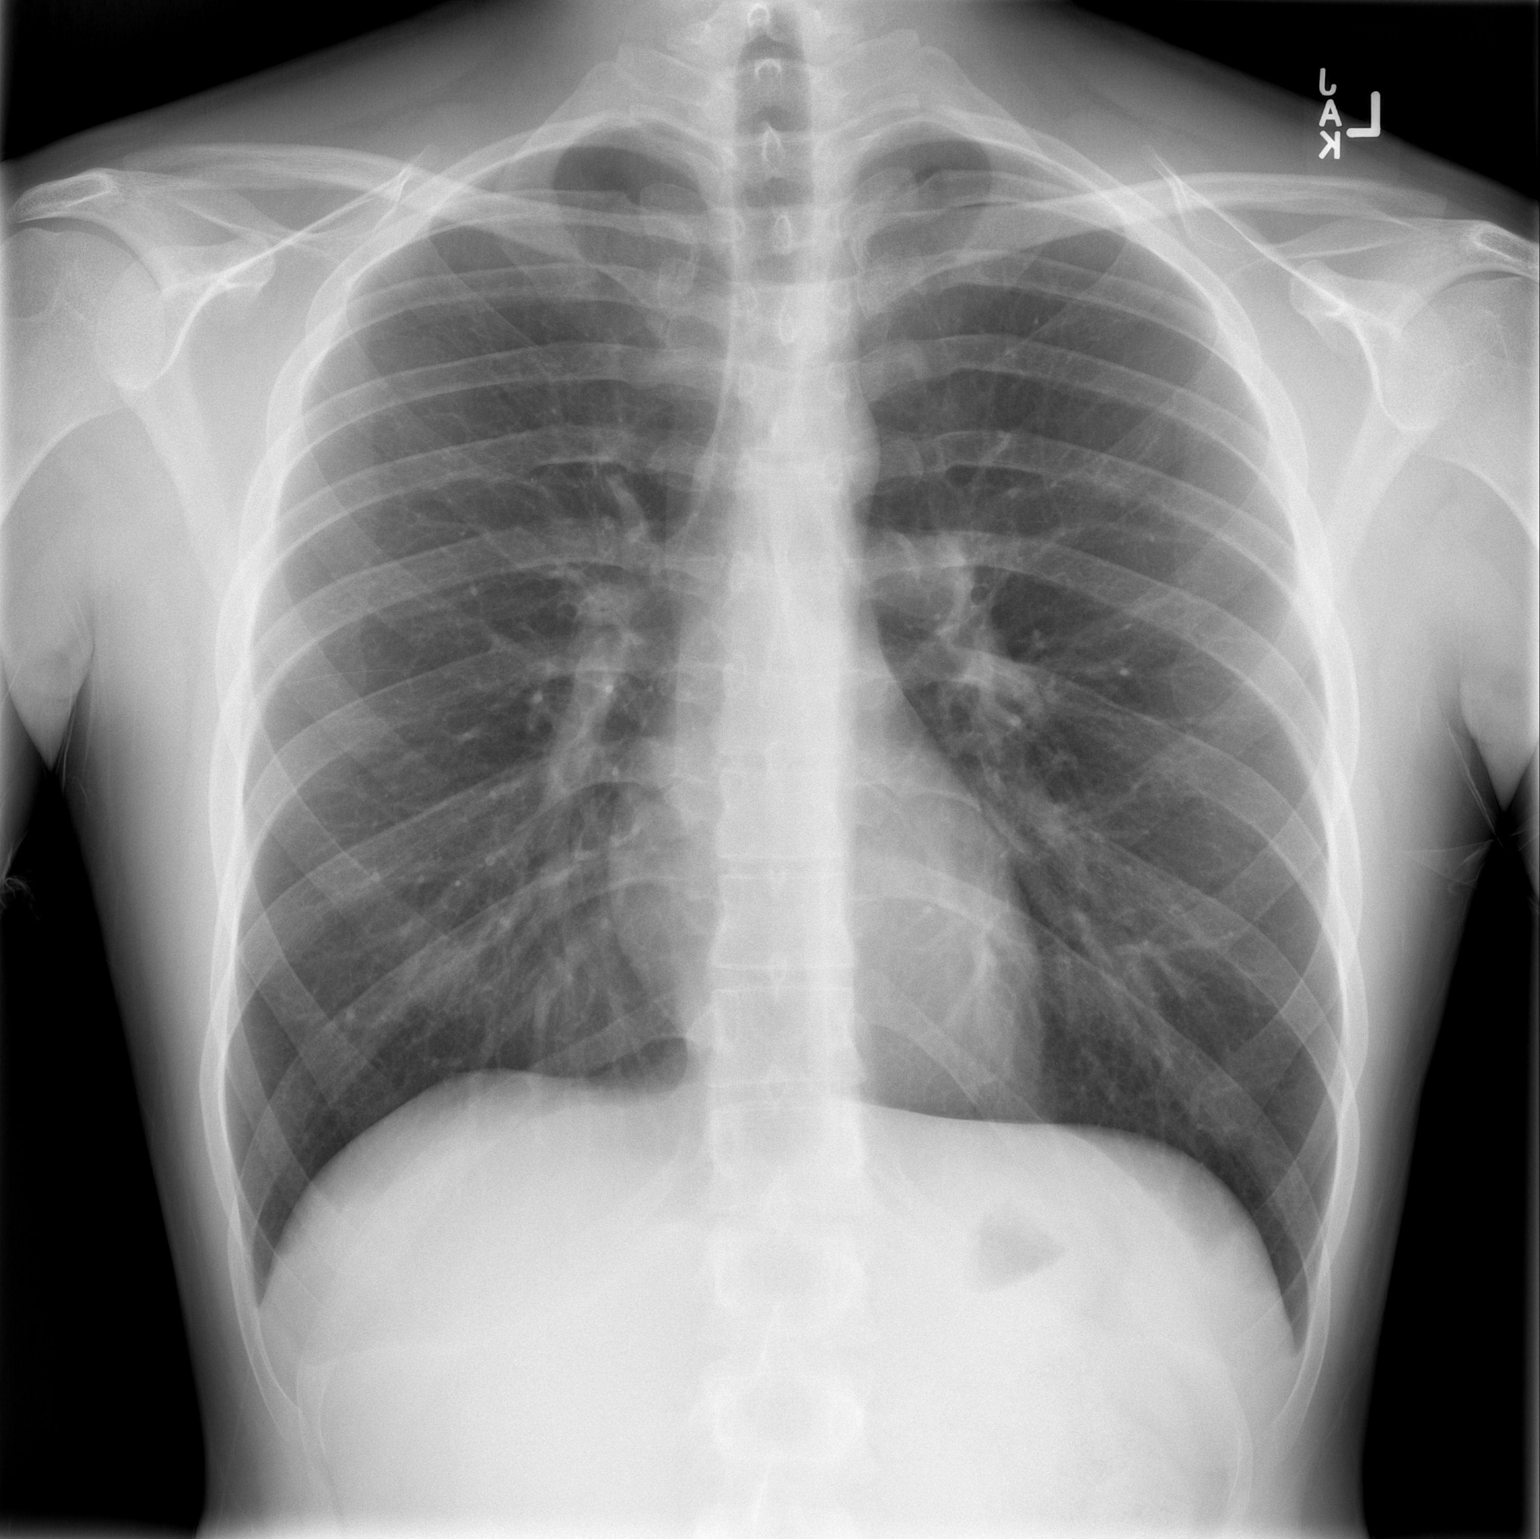

[3 of 3 positions shown; findings below may reference images not displayed]

FINDINGS: No fracture or other bone lesions are seen involving the ribs. There
is no evidence of pneumothorax or pleural effusion. Both lungs are
clear. Heart size and mediastinal contours are within normal limits.
IMPRESSION: No acute abnormality noted.

## 2018-09-18 DIAGNOSIS — H10413 Chronic giant papillary conjunctivitis, bilateral: Secondary | ICD-10-CM | POA: Diagnosis not present

## 2018-09-19 DIAGNOSIS — G43019 Migraine without aura, intractable, without status migrainosus: Secondary | ICD-10-CM | POA: Diagnosis not present

## 2018-09-19 DIAGNOSIS — G43719 Chronic migraine without aura, intractable, without status migrainosus: Secondary | ICD-10-CM | POA: Diagnosis not present

## 2018-10-05 DIAGNOSIS — F900 Attention-deficit hyperactivity disorder, predominantly inattentive type: Secondary | ICD-10-CM | POA: Diagnosis not present

## 2018-10-05 DIAGNOSIS — F23 Brief psychotic disorder: Secondary | ICD-10-CM | POA: Diagnosis not present

## 2018-10-05 DIAGNOSIS — F3341 Major depressive disorder, recurrent, in partial remission: Secondary | ICD-10-CM | POA: Diagnosis not present

## 2018-10-05 DIAGNOSIS — F411 Generalized anxiety disorder: Secondary | ICD-10-CM | POA: Diagnosis not present

## 2018-10-17 DIAGNOSIS — F341 Dysthymic disorder: Secondary | ICD-10-CM | POA: Diagnosis not present

## 2018-10-18 DIAGNOSIS — F411 Generalized anxiety disorder: Secondary | ICD-10-CM | POA: Diagnosis not present

## 2018-10-18 DIAGNOSIS — F23 Brief psychotic disorder: Secondary | ICD-10-CM | POA: Diagnosis not present

## 2018-10-18 DIAGNOSIS — F900 Attention-deficit hyperactivity disorder, predominantly inattentive type: Secondary | ICD-10-CM | POA: Diagnosis not present

## 2018-10-18 DIAGNOSIS — F3341 Major depressive disorder, recurrent, in partial remission: Secondary | ICD-10-CM | POA: Diagnosis not present

## 2018-10-23 DIAGNOSIS — Z79899 Other long term (current) drug therapy: Secondary | ICD-10-CM | POA: Diagnosis not present

## 2018-10-23 DIAGNOSIS — F332 Major depressive disorder, recurrent severe without psychotic features: Secondary | ICD-10-CM | POA: Diagnosis not present

## 2018-10-30 DIAGNOSIS — F341 Dysthymic disorder: Secondary | ICD-10-CM | POA: Diagnosis not present

## 2018-10-31 DIAGNOSIS — F250 Schizoaffective disorder, bipolar type: Secondary | ICD-10-CM | POA: Diagnosis not present

## 2018-10-31 DIAGNOSIS — F23 Brief psychotic disorder: Secondary | ICD-10-CM | POA: Diagnosis not present

## 2018-10-31 DIAGNOSIS — F900 Attention-deficit hyperactivity disorder, predominantly inattentive type: Secondary | ICD-10-CM | POA: Diagnosis not present

## 2018-10-31 DIAGNOSIS — G4700 Insomnia, unspecified: Secondary | ICD-10-CM | POA: Diagnosis not present

## 2018-11-07 DIAGNOSIS — F341 Dysthymic disorder: Secondary | ICD-10-CM | POA: Diagnosis not present

## 2018-11-21 DIAGNOSIS — G4700 Insomnia, unspecified: Secondary | ICD-10-CM | POA: Diagnosis not present

## 2018-11-21 DIAGNOSIS — F411 Generalized anxiety disorder: Secondary | ICD-10-CM | POA: Diagnosis not present

## 2018-11-21 DIAGNOSIS — F250 Schizoaffective disorder, bipolar type: Secondary | ICD-10-CM | POA: Diagnosis not present

## 2018-11-21 DIAGNOSIS — F900 Attention-deficit hyperactivity disorder, predominantly inattentive type: Secondary | ICD-10-CM | POA: Diagnosis not present

## 2018-11-28 DIAGNOSIS — F341 Dysthymic disorder: Secondary | ICD-10-CM | POA: Diagnosis not present

## 2018-12-19 DIAGNOSIS — F250 Schizoaffective disorder, bipolar type: Secondary | ICD-10-CM | POA: Diagnosis not present

## 2018-12-19 DIAGNOSIS — G4700 Insomnia, unspecified: Secondary | ICD-10-CM | POA: Diagnosis not present

## 2018-12-19 DIAGNOSIS — Z559 Problems related to education and literacy, unspecified: Secondary | ICD-10-CM | POA: Diagnosis not present

## 2018-12-19 DIAGNOSIS — F900 Attention-deficit hyperactivity disorder, predominantly inattentive type: Secondary | ICD-10-CM | POA: Diagnosis not present

## 2018-12-26 DIAGNOSIS — F341 Dysthymic disorder: Secondary | ICD-10-CM | POA: Diagnosis not present

## 2019-01-24 DIAGNOSIS — F23 Brief psychotic disorder: Secondary | ICD-10-CM | POA: Diagnosis not present

## 2019-01-24 DIAGNOSIS — F250 Schizoaffective disorder, bipolar type: Secondary | ICD-10-CM | POA: Diagnosis not present

## 2019-02-13 DIAGNOSIS — F341 Dysthymic disorder: Secondary | ICD-10-CM | POA: Diagnosis not present

## 2019-02-28 DIAGNOSIS — F23 Brief psychotic disorder: Secondary | ICD-10-CM | POA: Diagnosis not present

## 2019-02-28 DIAGNOSIS — F900 Attention-deficit hyperactivity disorder, predominantly inattentive type: Secondary | ICD-10-CM | POA: Diagnosis not present

## 2019-02-28 DIAGNOSIS — G4700 Insomnia, unspecified: Secondary | ICD-10-CM | POA: Diagnosis not present

## 2019-02-28 DIAGNOSIS — F250 Schizoaffective disorder, bipolar type: Secondary | ICD-10-CM | POA: Diagnosis not present

## 2019-03-13 DIAGNOSIS — F341 Dysthymic disorder: Secondary | ICD-10-CM | POA: Diagnosis not present

## 2019-03-29 DIAGNOSIS — F23 Brief psychotic disorder: Secondary | ICD-10-CM | POA: Diagnosis not present

## 2019-03-29 DIAGNOSIS — G4700 Insomnia, unspecified: Secondary | ICD-10-CM | POA: Diagnosis not present

## 2019-03-29 DIAGNOSIS — F411 Generalized anxiety disorder: Secondary | ICD-10-CM | POA: Diagnosis not present

## 2019-03-29 DIAGNOSIS — F250 Schizoaffective disorder, bipolar type: Secondary | ICD-10-CM | POA: Diagnosis not present

## 2019-04-03 DIAGNOSIS — F341 Dysthymic disorder: Secondary | ICD-10-CM | POA: Diagnosis not present

## 2019-04-24 DIAGNOSIS — F341 Dysthymic disorder: Secondary | ICD-10-CM | POA: Diagnosis not present

## 2019-04-25 DIAGNOSIS — F250 Schizoaffective disorder, bipolar type: Secondary | ICD-10-CM | POA: Diagnosis not present

## 2019-04-25 DIAGNOSIS — Z559 Problems related to education and literacy, unspecified: Secondary | ICD-10-CM | POA: Diagnosis not present

## 2019-04-25 DIAGNOSIS — F900 Attention-deficit hyperactivity disorder, predominantly inattentive type: Secondary | ICD-10-CM | POA: Diagnosis not present

## 2019-04-25 DIAGNOSIS — G4700 Insomnia, unspecified: Secondary | ICD-10-CM | POA: Diagnosis not present

## 2019-05-01 DIAGNOSIS — G43719 Chronic migraine without aura, intractable, without status migrainosus: Secondary | ICD-10-CM | POA: Diagnosis not present

## 2019-05-01 DIAGNOSIS — G43019 Migraine without aura, intractable, without status migrainosus: Secondary | ICD-10-CM | POA: Diagnosis not present

## 2019-05-15 DIAGNOSIS — F341 Dysthymic disorder: Secondary | ICD-10-CM | POA: Diagnosis not present

## 2019-05-24 DIAGNOSIS — F250 Schizoaffective disorder, bipolar type: Secondary | ICD-10-CM | POA: Diagnosis not present

## 2019-05-24 DIAGNOSIS — G4700 Insomnia, unspecified: Secondary | ICD-10-CM | POA: Diagnosis not present

## 2019-05-24 DIAGNOSIS — F411 Generalized anxiety disorder: Secondary | ICD-10-CM | POA: Diagnosis not present

## 2019-05-24 DIAGNOSIS — F900 Attention-deficit hyperactivity disorder, predominantly inattentive type: Secondary | ICD-10-CM | POA: Diagnosis not present

## 2019-06-05 DIAGNOSIS — F341 Dysthymic disorder: Secondary | ICD-10-CM | POA: Diagnosis not present

## 2019-06-26 DIAGNOSIS — F341 Dysthymic disorder: Secondary | ICD-10-CM | POA: Diagnosis not present

## 2019-06-27 DIAGNOSIS — R198 Other specified symptoms and signs involving the digestive system and abdomen: Secondary | ICD-10-CM | POA: Diagnosis not present

## 2019-06-27 DIAGNOSIS — F419 Anxiety disorder, unspecified: Secondary | ICD-10-CM | POA: Diagnosis not present

## 2019-06-27 DIAGNOSIS — K219 Gastro-esophageal reflux disease without esophagitis: Secondary | ICD-10-CM | POA: Diagnosis not present

## 2019-06-27 DIAGNOSIS — G43909 Migraine, unspecified, not intractable, without status migrainosus: Secondary | ICD-10-CM | POA: Diagnosis not present

## 2019-06-28 DIAGNOSIS — F900 Attention-deficit hyperactivity disorder, predominantly inattentive type: Secondary | ICD-10-CM | POA: Diagnosis not present

## 2019-06-28 DIAGNOSIS — F23 Brief psychotic disorder: Secondary | ICD-10-CM | POA: Diagnosis not present

## 2019-06-28 DIAGNOSIS — F250 Schizoaffective disorder, bipolar type: Secondary | ICD-10-CM | POA: Diagnosis not present

## 2019-06-28 DIAGNOSIS — F411 Generalized anxiety disorder: Secondary | ICD-10-CM | POA: Diagnosis not present

## 2019-06-28 DIAGNOSIS — G518 Other disorders of facial nerve: Secondary | ICD-10-CM | POA: Diagnosis not present

## 2019-06-28 DIAGNOSIS — G43019 Migraine without aura, intractable, without status migrainosus: Secondary | ICD-10-CM | POA: Diagnosis not present

## 2019-06-28 DIAGNOSIS — M791 Myalgia, unspecified site: Secondary | ICD-10-CM | POA: Diagnosis not present

## 2019-06-28 DIAGNOSIS — M542 Cervicalgia: Secondary | ICD-10-CM | POA: Diagnosis not present

## 2019-06-28 DIAGNOSIS — G43719 Chronic migraine without aura, intractable, without status migrainosus: Secondary | ICD-10-CM | POA: Diagnosis not present

## 2019-07-05 DIAGNOSIS — M791 Myalgia, unspecified site: Secondary | ICD-10-CM | POA: Diagnosis not present

## 2019-07-05 DIAGNOSIS — M542 Cervicalgia: Secondary | ICD-10-CM | POA: Diagnosis not present

## 2019-07-05 DIAGNOSIS — G43019 Migraine without aura, intractable, without status migrainosus: Secondary | ICD-10-CM | POA: Diagnosis not present

## 2019-07-05 DIAGNOSIS — G518 Other disorders of facial nerve: Secondary | ICD-10-CM | POA: Diagnosis not present

## 2019-07-05 DIAGNOSIS — G43719 Chronic migraine without aura, intractable, without status migrainosus: Secondary | ICD-10-CM | POA: Diagnosis not present

## 2019-07-11 DIAGNOSIS — R7401 Elevation of levels of liver transaminase levels: Secondary | ICD-10-CM | POA: Diagnosis not present

## 2019-07-18 DIAGNOSIS — F411 Generalized anxiety disorder: Secondary | ICD-10-CM | POA: Diagnosis not present

## 2019-07-18 DIAGNOSIS — F900 Attention-deficit hyperactivity disorder, predominantly inattentive type: Secondary | ICD-10-CM | POA: Diagnosis not present

## 2019-07-18 DIAGNOSIS — F250 Schizoaffective disorder, bipolar type: Secondary | ICD-10-CM | POA: Diagnosis not present

## 2019-07-18 DIAGNOSIS — F23 Brief psychotic disorder: Secondary | ICD-10-CM | POA: Diagnosis not present

## 2019-07-19 DIAGNOSIS — F341 Dysthymic disorder: Secondary | ICD-10-CM | POA: Diagnosis not present

## 2019-07-20 DIAGNOSIS — M542 Cervicalgia: Secondary | ICD-10-CM | POA: Diagnosis not present

## 2019-07-20 DIAGNOSIS — G43719 Chronic migraine without aura, intractable, without status migrainosus: Secondary | ICD-10-CM | POA: Diagnosis not present

## 2019-07-20 DIAGNOSIS — G43019 Migraine without aura, intractable, without status migrainosus: Secondary | ICD-10-CM | POA: Diagnosis not present

## 2019-07-20 DIAGNOSIS — M791 Myalgia, unspecified site: Secondary | ICD-10-CM | POA: Diagnosis not present

## 2019-07-20 DIAGNOSIS — G518 Other disorders of facial nerve: Secondary | ICD-10-CM | POA: Diagnosis not present

## 2019-07-23 DIAGNOSIS — R1310 Dysphagia, unspecified: Secondary | ICD-10-CM | POA: Diagnosis not present

## 2019-08-16 DIAGNOSIS — F341 Dysthymic disorder: Secondary | ICD-10-CM | POA: Diagnosis not present

## 2019-08-21 DIAGNOSIS — M542 Cervicalgia: Secondary | ICD-10-CM | POA: Diagnosis not present

## 2019-08-21 DIAGNOSIS — M791 Myalgia, unspecified site: Secondary | ICD-10-CM | POA: Diagnosis not present

## 2019-08-21 DIAGNOSIS — G518 Other disorders of facial nerve: Secondary | ICD-10-CM | POA: Diagnosis not present

## 2019-08-21 DIAGNOSIS — G43019 Migraine without aura, intractable, without status migrainosus: Secondary | ICD-10-CM | POA: Diagnosis not present

## 2019-08-21 DIAGNOSIS — G43719 Chronic migraine without aura, intractable, without status migrainosus: Secondary | ICD-10-CM | POA: Diagnosis not present

## 2019-09-03 DIAGNOSIS — F341 Dysthymic disorder: Secondary | ICD-10-CM | POA: Diagnosis not present

## 2019-09-04 DIAGNOSIS — F900 Attention-deficit hyperactivity disorder, predominantly inattentive type: Secondary | ICD-10-CM | POA: Diagnosis not present

## 2019-09-04 DIAGNOSIS — F250 Schizoaffective disorder, bipolar type: Secondary | ICD-10-CM | POA: Diagnosis not present

## 2019-09-04 DIAGNOSIS — Z559 Problems related to education and literacy, unspecified: Secondary | ICD-10-CM | POA: Diagnosis not present

## 2019-09-25 DIAGNOSIS — F341 Dysthymic disorder: Secondary | ICD-10-CM | POA: Diagnosis not present

## 2019-10-11 DIAGNOSIS — F411 Generalized anxiety disorder: Secondary | ICD-10-CM | POA: Diagnosis not present

## 2019-10-11 DIAGNOSIS — F900 Attention-deficit hyperactivity disorder, predominantly inattentive type: Secondary | ICD-10-CM | POA: Diagnosis not present

## 2019-10-11 DIAGNOSIS — F250 Schizoaffective disorder, bipolar type: Secondary | ICD-10-CM | POA: Diagnosis not present

## 2019-10-15 DIAGNOSIS — R195 Other fecal abnormalities: Secondary | ICD-10-CM | POA: Diagnosis not present

## 2019-10-15 DIAGNOSIS — K625 Hemorrhage of anus and rectum: Secondary | ICD-10-CM | POA: Diagnosis not present

## 2019-10-18 DIAGNOSIS — K625 Hemorrhage of anus and rectum: Secondary | ICD-10-CM | POA: Diagnosis not present

## 2019-10-18 DIAGNOSIS — R195 Other fecal abnormalities: Secondary | ICD-10-CM | POA: Diagnosis not present

## 2019-10-18 DIAGNOSIS — K921 Melena: Secondary | ICD-10-CM | POA: Diagnosis not present

## 2019-11-01 DIAGNOSIS — F341 Dysthymic disorder: Secondary | ICD-10-CM | POA: Diagnosis not present

## 2019-11-05 DIAGNOSIS — G43019 Migraine without aura, intractable, without status migrainosus: Secondary | ICD-10-CM | POA: Diagnosis not present

## 2019-11-05 DIAGNOSIS — G43719 Chronic migraine without aura, intractable, without status migrainosus: Secondary | ICD-10-CM | POA: Diagnosis not present

## 2019-11-05 DIAGNOSIS — M791 Myalgia, unspecified site: Secondary | ICD-10-CM | POA: Diagnosis not present

## 2019-11-05 DIAGNOSIS — R519 Headache, unspecified: Secondary | ICD-10-CM | POA: Diagnosis not present

## 2019-11-22 DIAGNOSIS — Z20822 Contact with and (suspected) exposure to covid-19: Secondary | ICD-10-CM | POA: Diagnosis not present

## 2019-12-11 DIAGNOSIS — F341 Dysthymic disorder: Secondary | ICD-10-CM | POA: Diagnosis not present

## 2019-12-20 DIAGNOSIS — M79671 Pain in right foot: Secondary | ICD-10-CM | POA: Diagnosis not present

## 2019-12-20 DIAGNOSIS — F23 Brief psychotic disorder: Secondary | ICD-10-CM | POA: Diagnosis not present

## 2019-12-20 DIAGNOSIS — F4323 Adjustment disorder with mixed anxiety and depressed mood: Secondary | ICD-10-CM | POA: Diagnosis not present

## 2019-12-20 DIAGNOSIS — G4700 Insomnia, unspecified: Secondary | ICD-10-CM | POA: Diagnosis not present

## 2019-12-20 DIAGNOSIS — M722 Plantar fascial fibromatosis: Secondary | ICD-10-CM | POA: Diagnosis not present

## 2019-12-20 DIAGNOSIS — M24572 Contracture, left ankle: Secondary | ICD-10-CM | POA: Diagnosis not present

## 2019-12-20 DIAGNOSIS — F250 Schizoaffective disorder, bipolar type: Secondary | ICD-10-CM | POA: Diagnosis not present

## 2019-12-20 DIAGNOSIS — M24571 Contracture, right ankle: Secondary | ICD-10-CM | POA: Diagnosis not present

## 2019-12-26 DIAGNOSIS — R112 Nausea with vomiting, unspecified: Secondary | ICD-10-CM | POA: Diagnosis not present

## 2019-12-26 DIAGNOSIS — K219 Gastro-esophageal reflux disease without esophagitis: Secondary | ICD-10-CM | POA: Diagnosis not present

## 2019-12-31 DIAGNOSIS — F341 Dysthymic disorder: Secondary | ICD-10-CM | POA: Diagnosis not present

## 2020-01-01 DIAGNOSIS — R112 Nausea with vomiting, unspecified: Secondary | ICD-10-CM | POA: Diagnosis not present

## 2020-01-01 DIAGNOSIS — K2970 Gastritis, unspecified, without bleeding: Secondary | ICD-10-CM | POA: Diagnosis not present

## 2020-01-01 DIAGNOSIS — K2971 Gastritis, unspecified, with bleeding: Secondary | ICD-10-CM | POA: Diagnosis not present

## 2020-01-02 ENCOUNTER — Other Ambulatory Visit (HOSPITAL_COMMUNITY): Payer: Self-pay | Admitting: Gastroenterology

## 2020-01-02 DIAGNOSIS — R110 Nausea: Secondary | ICD-10-CM

## 2020-01-22 ENCOUNTER — Encounter (HOSPITAL_COMMUNITY)
Admission: RE | Admit: 2020-01-22 | Discharge: 2020-01-22 | Disposition: A | Discharge status: Home / Self Care | Payer: Federal, State, Local not specified - PPO | Source: Ambulatory Visit | Attending: Gastroenterology | Admitting: Gastroenterology

## 2020-01-22 ENCOUNTER — Other Ambulatory Visit: Payer: Self-pay

## 2020-01-22 DIAGNOSIS — R109 Unspecified abdominal pain: Secondary | ICD-10-CM | POA: Diagnosis not present

## 2020-01-22 DIAGNOSIS — R110 Nausea: Secondary | ICD-10-CM | POA: Diagnosis not present

## 2020-01-22 DIAGNOSIS — F341 Dysthymic disorder: Secondary | ICD-10-CM | POA: Diagnosis not present

## 2020-01-22 MED ORDER — TECHNETIUM TC 99M SULFUR COLLOID
2.0000 | Freq: Once | INTRAVENOUS | Status: AC | PRN
  Administered 2020-01-22: 2 via INTRAVENOUS

## 2020-01-24 DIAGNOSIS — M24571 Contracture, right ankle: Secondary | ICD-10-CM | POA: Diagnosis not present

## 2020-01-24 DIAGNOSIS — M24572 Contracture, left ankle: Secondary | ICD-10-CM | POA: Diagnosis not present

## 2020-01-24 DIAGNOSIS — M792 Neuralgia and neuritis, unspecified: Secondary | ICD-10-CM | POA: Diagnosis not present

## 2020-01-24 DIAGNOSIS — M722 Plantar fascial fibromatosis: Secondary | ICD-10-CM | POA: Diagnosis not present

## 2020-01-24 DIAGNOSIS — M79671 Pain in right foot: Secondary | ICD-10-CM | POA: Diagnosis not present

## 2020-02-18 NOTE — Addendum Note (Signed)
Encounter addended by: Jidenna Figgs, [place] Z on: 02/18/2020 5:38 PM  Actions taken: Imaging Exam ended, Charge Capture section accepted

## 2020-02-25 DIAGNOSIS — M24571 Contracture, right ankle: Secondary | ICD-10-CM | POA: Diagnosis not present

## 2020-02-25 DIAGNOSIS — M79671 Pain in right foot: Secondary | ICD-10-CM | POA: Diagnosis not present

## 2020-02-25 DIAGNOSIS — M722 Plantar fascial fibromatosis: Secondary | ICD-10-CM | POA: Diagnosis not present

## 2020-02-25 DIAGNOSIS — M24572 Contracture, left ankle: Secondary | ICD-10-CM | POA: Diagnosis not present

## 2020-02-26 DIAGNOSIS — F250 Schizoaffective disorder, bipolar type: Secondary | ICD-10-CM | POA: Diagnosis not present

## 2020-02-26 DIAGNOSIS — G4700 Insomnia, unspecified: Secondary | ICD-10-CM | POA: Diagnosis not present

## 2020-02-26 DIAGNOSIS — F341 Dysthymic disorder: Secondary | ICD-10-CM | POA: Diagnosis not present

## 2020-02-26 DIAGNOSIS — F411 Generalized anxiety disorder: Secondary | ICD-10-CM | POA: Diagnosis not present

## 2020-02-26 DIAGNOSIS — F900 Attention-deficit hyperactivity disorder, predominantly inattentive type: Secondary | ICD-10-CM | POA: Diagnosis not present

## 2020-02-29 DIAGNOSIS — Z8709 Personal history of other diseases of the respiratory system: Secondary | ICD-10-CM | POA: Diagnosis not present

## 2020-02-29 DIAGNOSIS — R059 Cough, unspecified: Secondary | ICD-10-CM | POA: Diagnosis not present

## 2020-02-29 DIAGNOSIS — R062 Wheezing: Secondary | ICD-10-CM | POA: Diagnosis not present

## 2020-02-29 DIAGNOSIS — Z03818 Encounter for observation for suspected exposure to other biological agents ruled out: Secondary | ICD-10-CM | POA: Diagnosis not present

## 2020-02-29 DIAGNOSIS — J40 Bronchitis, not specified as acute or chronic: Secondary | ICD-10-CM | POA: Diagnosis not present

## 2020-03-26 DIAGNOSIS — M24572 Contracture, left ankle: Secondary | ICD-10-CM | POA: Diagnosis not present

## 2020-03-26 DIAGNOSIS — M24571 Contracture, right ankle: Secondary | ICD-10-CM | POA: Diagnosis not present

## 2020-03-26 DIAGNOSIS — M792 Neuralgia and neuritis, unspecified: Secondary | ICD-10-CM | POA: Diagnosis not present

## 2020-03-26 DIAGNOSIS — M79671 Pain in right foot: Secondary | ICD-10-CM | POA: Diagnosis not present

## 2020-03-31 DIAGNOSIS — F341 Dysthymic disorder: Secondary | ICD-10-CM | POA: Diagnosis not present

## 2020-04-29 DIAGNOSIS — F900 Attention-deficit hyperactivity disorder, predominantly inattentive type: Secondary | ICD-10-CM | POA: Diagnosis not present

## 2020-04-29 DIAGNOSIS — G4700 Insomnia, unspecified: Secondary | ICD-10-CM | POA: Diagnosis not present

## 2020-04-29 DIAGNOSIS — F341 Dysthymic disorder: Secondary | ICD-10-CM | POA: Diagnosis not present

## 2020-04-29 DIAGNOSIS — F250 Schizoaffective disorder, bipolar type: Secondary | ICD-10-CM | POA: Diagnosis not present

## 2020-05-16 ENCOUNTER — Other Ambulatory Visit: Payer: Self-pay | Admitting: Gastroenterology

## 2020-05-16 DIAGNOSIS — K219 Gastro-esophageal reflux disease without esophagitis: Secondary | ICD-10-CM | POA: Diagnosis not present

## 2020-05-16 DIAGNOSIS — R1032 Left lower quadrant pain: Secondary | ICD-10-CM | POA: Diagnosis not present

## 2020-05-16 DIAGNOSIS — R112 Nausea with vomiting, unspecified: Secondary | ICD-10-CM

## 2020-05-21 DIAGNOSIS — G43019 Migraine without aura, intractable, without status migrainosus: Secondary | ICD-10-CM | POA: Diagnosis not present

## 2020-05-21 DIAGNOSIS — G43719 Chronic migraine without aura, intractable, without status migrainosus: Secondary | ICD-10-CM | POA: Diagnosis not present

## 2020-05-27 DIAGNOSIS — F341 Dysthymic disorder: Secondary | ICD-10-CM | POA: Diagnosis not present

## 2020-05-30 ENCOUNTER — Ambulatory Visit
Admission: RE | Admit: 2020-05-30 | Discharge: 2020-05-30 | Disposition: A | Discharge status: Home / Self Care | Payer: BC Managed Care – PPO | Source: Ambulatory Visit | Attending: Gastroenterology | Admitting: Gastroenterology

## 2020-05-30 ENCOUNTER — Other Ambulatory Visit: Payer: Self-pay | Admitting: Gastroenterology

## 2020-05-30 DIAGNOSIS — R112 Nausea with vomiting, unspecified: Secondary | ICD-10-CM

## 2020-05-30 DIAGNOSIS — R1032 Left lower quadrant pain: Secondary | ICD-10-CM

## 2020-05-30 DIAGNOSIS — R110 Nausea: Secondary | ICD-10-CM | POA: Diagnosis not present

## 2020-05-30 DIAGNOSIS — K625 Hemorrhage of anus and rectum: Secondary | ICD-10-CM | POA: Diagnosis not present

## 2020-05-30 MED ORDER — IOPAMIDOL (ISOVUE-300) INJECTION 61%
100.0000 mL | Freq: Once | INTRAVENOUS | Status: AC | PRN
  Administered 2020-05-30: 100 mL via INTRAVENOUS

## 2020-06-09 DIAGNOSIS — R070 Pain in throat: Secondary | ICD-10-CM | POA: Diagnosis not present

## 2020-06-09 DIAGNOSIS — R112 Nausea with vomiting, unspecified: Secondary | ICD-10-CM | POA: Diagnosis not present

## 2020-06-09 DIAGNOSIS — K219 Gastro-esophageal reflux disease without esophagitis: Secondary | ICD-10-CM | POA: Diagnosis not present

## 2020-06-18 DIAGNOSIS — R569 Unspecified convulsions: Secondary | ICD-10-CM | POA: Diagnosis not present

## 2020-06-18 DIAGNOSIS — R1110 Vomiting, unspecified: Secondary | ICD-10-CM | POA: Diagnosis not present

## 2020-06-18 DIAGNOSIS — G43909 Migraine, unspecified, not intractable, without status migrainosus: Secondary | ICD-10-CM | POA: Diagnosis not present

## 2020-06-18 DIAGNOSIS — R112 Nausea with vomiting, unspecified: Secondary | ICD-10-CM | POA: Diagnosis not present

## 2020-06-18 DIAGNOSIS — Z79899 Other long term (current) drug therapy: Secondary | ICD-10-CM | POA: Diagnosis not present

## 2020-06-18 DIAGNOSIS — K3184 Gastroparesis: Secondary | ICD-10-CM | POA: Diagnosis not present

## 2020-06-18 DIAGNOSIS — R109 Unspecified abdominal pain: Secondary | ICD-10-CM | POA: Diagnosis not present

## 2020-06-18 DIAGNOSIS — R1115 Cyclical vomiting syndrome unrelated to migraine: Secondary | ICD-10-CM | POA: Diagnosis not present

## 2020-06-24 DIAGNOSIS — K625 Hemorrhage of anus and rectum: Secondary | ICD-10-CM | POA: Diagnosis not present

## 2020-07-01 DIAGNOSIS — F341 Dysthymic disorder: Secondary | ICD-10-CM | POA: Diagnosis not present

## 2020-07-09 DIAGNOSIS — R070 Pain in throat: Secondary | ICD-10-CM | POA: Diagnosis not present

## 2020-07-09 DIAGNOSIS — K219 Gastro-esophageal reflux disease without esophagitis: Secondary | ICD-10-CM | POA: Diagnosis not present

## 2020-07-21 DIAGNOSIS — R1115 Cyclical vomiting syndrome unrelated to migraine: Secondary | ICD-10-CM | POA: Diagnosis not present

## 2020-07-21 DIAGNOSIS — R112 Nausea with vomiting, unspecified: Secondary | ICD-10-CM | POA: Diagnosis not present

## 2020-07-24 DIAGNOSIS — G4700 Insomnia, unspecified: Secondary | ICD-10-CM | POA: Diagnosis not present

## 2020-07-24 DIAGNOSIS — F900 Attention-deficit hyperactivity disorder, predominantly inattentive type: Secondary | ICD-10-CM | POA: Diagnosis not present

## 2020-07-24 DIAGNOSIS — F250 Schizoaffective disorder, bipolar type: Secondary | ICD-10-CM | POA: Diagnosis not present

## 2020-07-28 DIAGNOSIS — Z20822 Contact with and (suspected) exposure to covid-19: Secondary | ICD-10-CM | POA: Diagnosis not present

## 2020-08-05 DIAGNOSIS — R112 Nausea with vomiting, unspecified: Secondary | ICD-10-CM | POA: Diagnosis not present

## 2020-08-05 DIAGNOSIS — R12 Heartburn: Secondary | ICD-10-CM | POA: Diagnosis not present

## 2020-08-06 DIAGNOSIS — R12 Heartburn: Secondary | ICD-10-CM | POA: Diagnosis not present

## 2020-08-06 DIAGNOSIS — R112 Nausea with vomiting, unspecified: Secondary | ICD-10-CM | POA: Diagnosis not present

## 2020-08-12 DIAGNOSIS — F341 Dysthymic disorder: Secondary | ICD-10-CM | POA: Diagnosis not present

## 2020-08-18 DIAGNOSIS — F341 Dysthymic disorder: Secondary | ICD-10-CM | POA: Diagnosis not present

## 2020-09-04 DIAGNOSIS — G4700 Insomnia, unspecified: Secondary | ICD-10-CM | POA: Diagnosis not present

## 2020-09-04 DIAGNOSIS — F900 Attention-deficit hyperactivity disorder, predominantly inattentive type: Secondary | ICD-10-CM | POA: Diagnosis not present

## 2020-09-04 DIAGNOSIS — F250 Schizoaffective disorder, bipolar type: Secondary | ICD-10-CM | POA: Diagnosis not present

## 2020-09-04 DIAGNOSIS — F23 Brief psychotic disorder: Secondary | ICD-10-CM | POA: Diagnosis not present

## 2020-09-09 DIAGNOSIS — R400 Somnolence: Secondary | ICD-10-CM | POA: Diagnosis not present

## 2020-09-09 DIAGNOSIS — R55 Syncope and collapse: Secondary | ICD-10-CM | POA: Diagnosis not present

## 2020-09-09 DIAGNOSIS — F341 Dysthymic disorder: Secondary | ICD-10-CM | POA: Diagnosis not present

## 2020-09-11 DIAGNOSIS — G4721 Circadian rhythm sleep disorder, delayed sleep phase type: Secondary | ICD-10-CM | POA: Diagnosis not present

## 2020-09-11 DIAGNOSIS — E669 Obesity, unspecified: Secondary | ICD-10-CM | POA: Diagnosis not present

## 2020-09-11 DIAGNOSIS — F5112 Insufficient sleep syndrome: Secondary | ICD-10-CM | POA: Diagnosis not present

## 2020-09-11 DIAGNOSIS — G4719 Other hypersomnia: Secondary | ICD-10-CM | POA: Diagnosis not present

## 2020-09-15 DIAGNOSIS — R070 Pain in throat: Secondary | ICD-10-CM | POA: Diagnosis not present

## 2020-09-15 DIAGNOSIS — K219 Gastro-esophageal reflux disease without esophagitis: Secondary | ICD-10-CM | POA: Diagnosis not present

## 2020-09-16 DIAGNOSIS — G4710 Hypersomnia, unspecified: Secondary | ICD-10-CM | POA: Diagnosis not present

## 2020-09-16 DIAGNOSIS — G4719 Other hypersomnia: Secondary | ICD-10-CM | POA: Diagnosis not present

## 2020-09-16 DIAGNOSIS — G4721 Circadian rhythm sleep disorder, delayed sleep phase type: Secondary | ICD-10-CM | POA: Diagnosis not present

## 2020-09-17 DIAGNOSIS — R112 Nausea with vomiting, unspecified: Secondary | ICD-10-CM | POA: Diagnosis not present

## 2020-09-17 DIAGNOSIS — K222 Esophageal obstruction: Secondary | ICD-10-CM | POA: Diagnosis not present

## 2020-09-26 ENCOUNTER — Other Ambulatory Visit (HOSPITAL_BASED_OUTPATIENT_CLINIC_OR_DEPARTMENT_OTHER): Payer: Self-pay

## 2020-09-26 DIAGNOSIS — R0683 Snoring: Secondary | ICD-10-CM

## 2020-09-26 DIAGNOSIS — G4719 Other hypersomnia: Secondary | ICD-10-CM

## 2020-09-26 DIAGNOSIS — R0681 Apnea, not elsewhere classified: Secondary | ICD-10-CM

## 2020-10-02 DIAGNOSIS — Z20822 Contact with and (suspected) exposure to covid-19: Secondary | ICD-10-CM | POA: Diagnosis not present

## 2020-10-02 DIAGNOSIS — Z03818 Encounter for observation for suspected exposure to other biological agents ruled out: Secondary | ICD-10-CM | POA: Diagnosis not present

## 2020-10-03 DIAGNOSIS — R112 Nausea with vomiting, unspecified: Secondary | ICD-10-CM | POA: Diagnosis not present

## 2020-10-03 DIAGNOSIS — K222 Esophageal obstruction: Secondary | ICD-10-CM | POA: Diagnosis not present

## 2020-10-07 DIAGNOSIS — F341 Dysthymic disorder: Secondary | ICD-10-CM | POA: Diagnosis not present

## 2020-10-14 DIAGNOSIS — F250 Schizoaffective disorder, bipolar type: Secondary | ICD-10-CM | POA: Diagnosis not present

## 2020-10-14 DIAGNOSIS — F900 Attention-deficit hyperactivity disorder, predominantly inattentive type: Secondary | ICD-10-CM | POA: Diagnosis not present

## 2020-10-14 DIAGNOSIS — G4700 Insomnia, unspecified: Secondary | ICD-10-CM | POA: Diagnosis not present

## 2020-10-14 DIAGNOSIS — F411 Generalized anxiety disorder: Secondary | ICD-10-CM | POA: Diagnosis not present

## 2020-11-02 ENCOUNTER — Encounter (HOSPITAL_COMMUNITY): Payer: Self-pay | Admitting: Emergency Medicine

## 2020-11-02 ENCOUNTER — Other Ambulatory Visit: Payer: Self-pay

## 2020-11-02 ENCOUNTER — Inpatient Hospital Stay (HOSPITAL_COMMUNITY)
Admission: AD | Admit: 2020-11-02 | Discharge: 2020-11-07 | DRG: 885 | Disposition: A | Discharge status: Home / Self Care | Payer: BC Managed Care – PPO | Attending: Psychiatry | Admitting: Psychiatry

## 2020-11-02 DIAGNOSIS — F332 Major depressive disorder, recurrent severe without psychotic features: Secondary | ICD-10-CM | POA: Diagnosis present

## 2020-11-02 DIAGNOSIS — G43809 Other migraine, not intractable, without status migrainosus: Secondary | ICD-10-CM

## 2020-11-02 DIAGNOSIS — E785 Hyperlipidemia, unspecified: Secondary | ICD-10-CM | POA: Diagnosis present

## 2020-11-02 DIAGNOSIS — Z20822 Contact with and (suspected) exposure to covid-19: Secondary | ICD-10-CM | POA: Diagnosis not present

## 2020-11-02 DIAGNOSIS — R45851 Suicidal ideations: Secondary | ICD-10-CM | POA: Diagnosis present

## 2020-11-02 DIAGNOSIS — G4730 Sleep apnea, unspecified: Secondary | ICD-10-CM | POA: Diagnosis present

## 2020-11-02 DIAGNOSIS — Z79899 Other long term (current) drug therapy: Secondary | ICD-10-CM | POA: Diagnosis not present

## 2020-11-02 DIAGNOSIS — R0683 Snoring: Secondary | ICD-10-CM | POA: Diagnosis present

## 2020-11-02 DIAGNOSIS — K219 Gastro-esophageal reflux disease without esophagitis: Secondary | ICD-10-CM | POA: Diagnosis not present

## 2020-11-02 DIAGNOSIS — G4719 Other hypersomnia: Secondary | ICD-10-CM | POA: Diagnosis not present

## 2020-11-02 DIAGNOSIS — G43909 Migraine, unspecified, not intractable, without status migrainosus: Secondary | ICD-10-CM | POA: Diagnosis present

## 2020-11-02 DIAGNOSIS — F329 Major depressive disorder, single episode, unspecified: Secondary | ICD-10-CM | POA: Diagnosis present

## 2020-11-02 DIAGNOSIS — Z888 Allergy status to other drugs, medicaments and biological substances status: Secondary | ICD-10-CM | POA: Diagnosis not present

## 2020-11-02 DIAGNOSIS — R197 Diarrhea, unspecified: Secondary | ICD-10-CM | POA: Diagnosis not present

## 2020-11-02 DIAGNOSIS — Z9151 Personal history of suicidal behavior: Secondary | ICD-10-CM | POA: Diagnosis not present

## 2020-11-02 DIAGNOSIS — E781 Pure hyperglyceridemia: Secondary | ICD-10-CM | POA: Diagnosis not present

## 2020-11-02 DIAGNOSIS — G4710 Hypersomnia, unspecified: Secondary | ICD-10-CM | POA: Diagnosis not present

## 2020-11-02 DIAGNOSIS — F5105 Insomnia due to other mental disorder: Secondary | ICD-10-CM | POA: Diagnosis not present

## 2020-11-02 DIAGNOSIS — F333 Major depressive disorder, recurrent, severe with psychotic symptoms: Secondary | ICD-10-CM | POA: Diagnosis not present

## 2020-11-02 DIAGNOSIS — Z87891 Personal history of nicotine dependence: Secondary | ICD-10-CM | POA: Diagnosis not present

## 2020-11-02 DIAGNOSIS — Z818 Family history of other mental and behavioral disorders: Secondary | ICD-10-CM

## 2020-11-02 DIAGNOSIS — F419 Anxiety disorder, unspecified: Secondary | ICD-10-CM | POA: Diagnosis present

## 2020-11-02 DIAGNOSIS — E559 Vitamin D deficiency, unspecified: Secondary | ICD-10-CM | POA: Diagnosis present

## 2020-11-02 LAB — RESP PANEL BY RT-PCR (FLU A&B, COVID) ARPGX2
Influenza A by PCR: NEGATIVE
Influenza B by PCR: NEGATIVE
SARS Coronavirus 2 by RT PCR: NEGATIVE

## 2020-11-02 MED ORDER — ESCITALOPRAM OXALATE 20 MG PO TABS
30.0000 mg | ORAL_TABLET | Freq: Every day | ORAL | Status: DC
  Administered 2020-11-02 – 2020-11-06 (×5): 30 mg via ORAL
  Filled 2020-11-02 (×6): qty 1

## 2020-11-02 MED ORDER — HYDROXYZINE HCL 50 MG PO TABS
50.0000 mg | ORAL_TABLET | Freq: Three times a day (TID) | ORAL | Status: DC | PRN
  Administered 2020-11-03: 50 mg via ORAL
  Filled 2020-11-02: qty 1

## 2020-11-02 MED ORDER — ZOLMITRIPTAN 5 MG NA SOLN: 1.0000 | NASAL | Status: DC | PRN

## 2020-11-02 MED ORDER — GABAPENTIN 100 MG PO CAPS
100.0000 mg | ORAL_CAPSULE | Freq: Two times a day (BID) | ORAL | Status: DC
  Administered 2020-11-03 – 2020-11-07 (×10): 100 mg via ORAL
  Filled 2020-11-02 (×13): qty 1

## 2020-11-02 MED ORDER — PANTOPRAZOLE SODIUM 40 MG PO TBEC
40.0000 mg | DELAYED_RELEASE_TABLET | ORAL | Status: DC
  Administered 2020-11-02 – 2020-11-07 (×10): 40 mg via ORAL
  Filled 2020-11-02 (×17): qty 1

## 2020-11-02 MED ORDER — GABAPENTIN 400 MG PO CAPS
400.0000 mg | ORAL_CAPSULE | Freq: Every day | ORAL | Status: DC
  Administered 2020-11-02 – 2020-11-06 (×5): 400 mg via ORAL
  Filled 2020-11-02 (×7): qty 1

## 2020-11-02 MED ORDER — RISPERIDONE 3 MG PO TABS
6.0000 mg | ORAL_TABLET | Freq: Every day | ORAL | Status: DC
  Administered 2020-11-02 – 2020-11-06 (×5): 6 mg via ORAL
  Filled 2020-11-02 (×3): qty 2
  Filled 2020-11-02: qty 6
  Filled 2020-11-02 (×3): qty 2

## 2020-11-02 MED ORDER — ZONISAMIDE 25 MG PO CAPS
250.0000 mg | ORAL_CAPSULE | Freq: Every day | ORAL | Status: DC
  Administered 2020-11-02 – 2020-11-06 (×5): 250 mg via ORAL
  Filled 2020-11-02 (×7): qty 2
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
## 2020-11-02 NOTE — H&P (Signed)
Behavioral Health Medical Screening Exam  Jeffery Johnson is a 25 y.o. Caucasian male with prior hx of Major depressive disorder with psychotic features & generalized anxiety disorder. He has been a patient in this [facility] with similar complaints. He was treated stabilized & discharged with outpatient psychiatric services. Jeffery Johnson returns to the [facility] today as a walk-in with his fiance Advertising account executive) with complain of worsening depressive symptoms, auditory/visual hallucinations & suicidal ideations of 5 days with thoughts of using a knife to kill himself or drink some kind of chemical. During this medical screening evaluation with the support of his fiance, [name] reports,  "I have been having auditory hallucinations for about 2 years now. I was on Latuda. However, I started having some stomach ailments (gagging/vomiting) of unknown cause. His Latuda was discontinued a week  ago by his doctor with the hope that it may be the culprit. He is currently on Omeprazole for GERD. He says his stomach symptoms has subsided, but his hallucinations, delusional thinking & feeling of paranoia worsened in the last 5 days. He says he also developed suicidal ideations. He says his suicidal ideations has become so strong that he does not trust himself going to the kitchen because there are knives in the kitchen, he is afraid he is going to push the knife into his heart. He says he works at [employer] made it a point to not go near where they have chemicals stocked for fear of drinking the chemicals to kill himself. He does have hx of suicide attempt in the past by self-mutilation. He reported as his medical hx: Seizure activities 1-2 years ago, currently not on medications for seizures, GERD - on Omeprazole & migraine headaches - taking Zonasimide.  Jeffery Johnson is currently receiving counseling services for depression. Is receiving psychiatric care with Dr. Madaline Guthrie. He is currently recommended for inpatient hospitalization for mood stabilization & safety.  He denies any drug or alcohol use. He is on Clonazepam 0.5 mg po Q bedtime for anxiety.  Total Time spent with patient: 30 minutes  Psychiatric Specialty Exam: Physical Exam Constitutional:      Appearance: Normal appearance.  HENT:     Head: Normocephalic.     Nose: Nose normal.     Mouth/Throat:     Pharynx: Oropharynx is clear.  Eyes:     Pupils: Pupils are equal, round, and reactive to light.  Pulmonary:     Effort: Pulmonary effort is normal.  Genitourinary:    Comments: Deferred Musculoskeletal:        General: Normal range of motion.     Cervical back: Normal range of motion.  Skin:    General: Skin is warm and dry.  Neurological:     General: No focal deficit present.     Mental Status: He is alert and oriented to person, place, and time.   Review of Systems  Constitutional:  Negative for chills and fever.  HENT:  Negative for congestion, sneezing and sore throat.   Respiratory:  Negative for cough, shortness of breath and wheezing.   Cardiovascular:  Negative for chest pain and palpitations.  Gastrointestinal:  Positive for abdominal pain (Per pt reports), nausea (Per patient's report) and vomiting (Per patient's reports).       Hx. GERD  Endocrine: Negative for cold intolerance.  Genitourinary:  Negative for difficulty urinating.  Musculoskeletal:  Negative for arthralgias and myalgias.  Skin: Negative.   Allergic/Immunologic:       Allergies: NKDA  Neurological:  Negative for dizziness, tremors,  seizures (Hx of seizures 1-2 years ago), syncope, facial asymmetry, speech difficulty, weakness, light-headedness, numbness and headaches.  Psychiatric/Behavioral:  Positive for dysphoric mood, hallucinations and suicidal ideas. Negative for agitation, behavioral problems, confusion, decreased concentration, self-injury (Hx of) and sleep disturbance. The patient is nervous/anxious. The patient is not hyperactive.   Blood pressure 138/79, pulse 80, temperature 97.8 F  (36.6 C), temperature source Oral, resp. rate 18.There is no height or weight on file to calculate BMI.  General Appearance: Casual and Fairly Groomed  Eye Contact:  Good  Speech:  Clear and Coherent and Normal Rate  Volume:  Normal  Mood:  Anxious and Depressed  Affect:  Congruent and Flat  Thought Process:  Coherent and Descriptions of Associations: Intact  Orientation:  Full (Time, Place, and Person)  Thought Content:  Delusions, Hallucinations: Auditory Visual, and Paranoid Ideation  Suicidal Thoughts: Yes, with plans but without intent.  Homicidal Thoughts:  No  Memory:  Immediate;   Good Recent;   Good Remote;   Good  Judgement:  Fair  Insight:  Present  Psychomotor Activity:  Normal  Concentration: Concentration: Good and Attention Span: Good Recall:  Good Fund of Knowledge:Fair Language: Good Akathisia:  NA Handed:  Right AIMS (if indicated):    Assets:  Communication Skills Desire for Improvement Resilience Social Support Vocational/Educational Sleep:     Musculoskeletal: Strength & Muscle Tone: within normal limits Gait & Station: normal Patient leans: N/A  Blood pressure 138/79, pulse 80, temperature 97.8 F (36.6 C), temperature source Oral, resp. rate 18.  Recommendations: Inpatient hospitalization for further evaluation/treatments of worsening psychosis/suicidal ideations. Based on my evaluation the patient does not appear to have an emergency medical condition. Will obtain Covid test.   Armandina Stammer, NP, pmhnp, fnp-bc 11/02/2020, 4:59 PM

## 2020-11-02 NOTE — Tx Team (Signed)
Initial Treatment Plan 11/02/2020 10:38 PM Jeffery Johnson UYQ:[id number]    PATIENT STRESSORS: Educational concerns Other: Patient's parents are going divorcing   PATIENT STRENGTHS: Ability for insight Average or above average intelligence Communication skills Motivation for treatment/growth Supportive family/friends   PATIENT IDENTIFIED PROBLEMS: SI  Depression                   DISCHARGE CRITERIA:  Improved stabilization in mood, thinking, and/or behavior  PRELIMINARY DISCHARGE PLAN: Return to previous living arrangement Return to previous work or school arrangements  PATIENT/FAMILY INVOLVEMENT: This treatment plan has been presented to and reviewed with the patient, Jeffery Johnson, and/or family member.  The patient and family have been given the opportunity to ask questions and make suggestions.  Floyce Stakes, RN 11/02/2020, 10:38 PM

## 2020-11-02 NOTE — Progress Notes (Addendum)
Patient is a 25 year old male reporting that he has been having increased depression symptoms and suicidal thoughts for 5 days. He reports feeling unsafe at home d/t knives in the kitchen and at work thoughts to drink some kind of chemical. He reports having auditory hallucinations but the voices are not audible. He signed a 72 hour request for discharge because he has upcoming doctors appointment he doesn't want to miss. Patient was oriented to unit and 15 min checks in place.

## 2020-11-02 NOTE — [facility] Suicide Risk Assessment (Signed)
[facility] Admission Suicide Risk Assessment   Nursing information obtained from:    Demographic factors:    Current Mental Status:    Loss Factors:    Historical Factors:    Risk Reduction Factors:     Total Time spent with patient: 1 hour Principal Problem: Severe episode of recurrent major depressive disorder, with psychotic features (HCC) Diagnosis:  Principal Problem:   Severe episode of recurrent major depressive disorder, with psychotic features (HCC) Active Problems:   Excessive daytime sleepiness   Insomnia due to mental condition   Hypersomnia with sleep apnea   Migraine   Anxiety  Subjective Data: "I am having my medications adjusted by my outpatient psychiatrist, and I have been having suicidal thoughts of wanting to stab myself in the chest.  I am afraid to go into my kitchen, and I am afraid to be at work for fear of being poisoned by chemicals."  Patient is seen independently at the request of nurse practitioner completing consult/H&P.  Patient meets criteria for involuntary commitment for his safety, however he states that he is willing to sign in voluntarily.  He does have appointment scheduled with his outpatient psychiatrist as well as his therapist.  Of more importance to patient, is a sleep study that is scheduled 11/07/2020.  Patient has been waiting a long time in order to have this study, and does not want to miss it.  He has signed a 72-hour form.  Once on the unit, patient is seen again with his mother.  Medication reviewed is done and admission orders have been placed.  Patient is able to contract for safety.  From initial HPI:  Jeffery Johnson is a 25 y.o. Caucasian male with prior hx of Major depressive disorder with psychotic features & generalized anxiety disorder. He has been a patient in this [facility] with similar complaints. He was treated stabilized & discharged with outpatient psychiatric services. Jeffery Johnson returns to the [facility] today as a walk-in with his fiance Advertising account executive) with  complain of worsening depressive symptoms, auditory/visual hallucinations & suicidal ideations of 5 days with thoughts of using a knife to kill himself or drink some kind of chemical. During this medical screening evaluation with the support of his fiance, [name] reports,  "I have been having auditory hallucinations for about 2 years now. I was on Latuda. However, I started having some stomach ailments (gagging/vomiting) of unknown cause. His Latuda was discontinued a week  ago by his doctor with the hope that it may be the culprit. He is currently on Omeprazole for GERD. He says his stomach symptoms has subsided, but his hallucinations, delusional thinking & feeling of paranoia worsened in the last 5 days. He says he also developed suicidal ideations. He says his suicidal ideations has become so strong that he does not trust himself going to the kitchen because there are knives in the kitchen, he is afraid he is going to push the knife into his heart. He says he works at [employer] made it a point to not go near where they have chemicals stocked for fear of drinking the chemicals to kill himself. He does have hx of suicide attempt in the past by self-mutilation. He reported as his medical hx: Seizure activities 1-2 years ago, currently not on medications for seizures, GERD - on Omeprazole & migraine headaches - taking Zonasimide.  Jeffery Johnson is currently receiving counseling services for depression. Is receiving psychiatric care with Dr. Madaline Guthrie. He is currently recommended for inpatient hospitalization for mood stabilization &  safety. He denies any drug or alcohol use. He is on Clonazepam 0.5 mg po Q bedtime for anxiety.   Continued Clinical Symptoms:    The "Alcohol Use Disorders Identification Test", Guidelines for Use in Primary Care, Second Edition.  World Science writer [facility]). Score between 0-7:  no or low risk or alcohol related problems. Score between 8-15:  moderate risk of alcohol related  problems. Score between 16-19:  high risk of alcohol related problems. Score 20 or above:  warrants further diagnostic evaluation for alcohol dependence and treatment.   CLINICAL FACTORS:   Severe Anxiety and/or Agitation Depression:   Insomnia Severe Currently Psychotic   Musculoskeletal: Strength & Muscle Tone: within normal limits Gait & Station: normal Patient leans: N/A  Psychiatric Specialty Exam:  General Appearance: Casual and Fairly Groomed   Eye Contact:  Good   Speech:  Clear and Coherent and Normal Rate   Volume:  Normal   Mood:  Anxious and Depressed   Affect:  Congruent and Flat   Thought Process:  Coherent and Descriptions of Associations: Intact   Orientation:  Full (Time, Place, and Person)   Thought Content:  Delusions, Hallucinations: Auditory Visual, and Paranoid Ideation   Suicidal Thoughts: Yes, with plans    Homicidal Thoughts:  No   Memory:  Immediate;   Good Recent;   Good Remote;   Good   Judgement:  Fair   Insight:  Present   Psychomotor Activity:  Normal   Concentration: Concentration: Good and Attention Span: Good Recall:  Good Fund of Knowledge:Fair Language: Good Akathisia:  NA Handed:  Right AIMS (if indicated):    Assets:  Communication Skills Desire for Improvement Resilience Social Support Vocational/Educational Sleep:   poor   Physical Exam: Physical Exam Vitals and nursing note reviewed. Exam conducted with a chaperone present.  Constitutional:      Appearance: Normal appearance. He is obese.  HENT:     Head: Normocephalic and atraumatic.     Nose: Nose normal.  Eyes:     Extraocular Movements: Extraocular movements intact.  Cardiovascular:     Rate and Rhythm: Normal rate.  Pulmonary:     Effort: Pulmonary effort is normal. No respiratory distress.  Musculoskeletal:        General: Normal range of motion.     Cervical back: Normal range of motion.  Neurological:     General: No focal deficit  present.     Mental Status: He is alert and oriented to person, place, and time.   Review of Systems  Constitutional: Negative.   Respiratory: Negative.    Cardiovascular: Negative.   Gastrointestinal:  Positive for nausea and vomiting.  Neurological:  Positive for headaches.  Psychiatric/Behavioral:  Positive for depression and suicidal ideas. Negative for hallucinations, memory loss and substance abuse. The patient is nervous/anxious and has insomnia.   Blood pressure 138/79, pulse 80, temperature 97.8 F (36.6 C), temperature source Oral, resp. rate 18. There is no height or weight on file to calculate BMI.   COGNITIVE FEATURES THAT CONTRIBUTE TO RISK:  Thought constriction (tunnel vision)    SUICIDE RISK:   Severe:  Frequent, intense, and enduring suicidal ideation, specific plan, no subjective intent, but some objective markers of intent (i.e., choice of lethal method), the method is accessible, some limited preparatory behavior, evidence of impaired self-control, severe dysphoria/symptomatology, multiple risk factors present, and few if any protective factors, particularly a lack of social support.  PLAN OF CARE:   Jeffery Johnson is a 25  y.o. male  with prior hx of Major depressive disorder with psychotic features & generalized anxiety disorder.  He presents with psychotic delusions and fear that he will make a suicide attempt.  He has recently transition from Jordan to Risperdal.  Twice a day dosing was too sedating during the day, so he was maintained at 4 mg at bedtime.  He has not found this to be effective.  He also recently increased his Lexapro to 30 mg daily.  He is uncertain if this has been effective at this point.  Patient is taking gabapentin at night for gagging/vomiting issues.  He attempted 300 mg during the day, which he found to be too sedating.  He is agreeable to starting 100 mg with breakfast and lunch and 400 mg at night.  He does routinely take hydroxyzine as needed  for anxiety and sleep at 50 mg a day.  He uses omeprazole for reflux which will be transitioned to Protonix.  I have not continued patient's Klonopin at bedtime as we have increased Risperdal dose, gabapentin dose and he also takes Zonegran for migraines and hydroxyzine for sleep.  He is advised to let the nurses know if he is still having difficulty sleeping at night.  Recommend every 15 minute checks Encourage patient to interact in the milieu and attend group therapies. Routine labs ordered as indicated: CBC, CMP, hemoglobin A1c, lipid panel, TSH, rapid urine drug screen.  In addition ferritin level and vitamin D level to further assess patient's underlying sleep disorder..  Record review shows TSH, lipid panel, hemoglobin A1c last done 2 years ago.  Patient has had significant weight gain since this time. Patient signed 72-hour form.  He did have a therapy appointment scheduled for tomorrow.  Appreciate social work helping patient coordinate rescheduling this appointment.  I certify that inpatient services furnished can reasonably be expected to improve the patient's condition.   Mariel Craft, MD 11/02/2020, 7:46 PM

## 2020-11-03 ENCOUNTER — Encounter (HOSPITAL_COMMUNITY): Payer: Self-pay

## 2020-11-03 DIAGNOSIS — F332 Major depressive disorder, recurrent severe without psychotic features: Secondary | ICD-10-CM | POA: Diagnosis present

## 2020-11-03 LAB — CBC WITH DIFFERENTIAL/PLATELET
Abs Immature Granulocytes: 0.04 10*3/uL (ref 0.00–0.07)
Basophils Absolute: 0 10*3/uL (ref 0.0–0.1)
Basophils Relative: 1 %
Eosinophils Absolute: 0.3 10*3/uL (ref 0.0–0.5)
Eosinophils Relative: 4 %
HCT: 44.8 % (ref 39.0–52.0)
Hemoglobin: 15.7 g/dL (ref 13.0–17.0)
Immature Granulocytes: 1 %
Lymphocytes Relative: 42 %
Lymphs Abs: 3.4 10*3/uL (ref 0.7–4.0)
MCH: 30.5 pg (ref 26.0–34.0)
MCHC: 35 g/dL (ref 30.0–36.0)
MCV: 87 fL (ref 80.0–100.0)
Monocytes Absolute: 0.7 10*3/uL (ref 0.1–1.0)
Monocytes Relative: 9 %
Neutro Abs: 3.3 10*3/uL (ref 1.7–7.7)
Neutrophils Relative %: 43 %
Platelets: 281 10*3/uL (ref 150–400)
RBC: 5.15 MIL/uL (ref 4.22–5.81)
RDW: 12 % (ref 11.5–15.5)
WBC: 7.8 10*3/uL (ref 4.0–10.5)
nRBC: 0 % (ref 0.0–0.2)

## 2020-11-03 LAB — RAPID URINE DRUG SCREEN, HOSP PERFORMED
Amphetamines: NOT DETECTED
Barbiturates: NOT DETECTED
Benzodiazepines: NOT DETECTED
Cocaine: NOT DETECTED
Opiates: NOT DETECTED
Tetrahydrocannabinol: NOT DETECTED

## 2020-11-03 LAB — LIPID PANEL
Cholesterol: 221 mg/dL — ABNORMAL HIGH (ref 0–200)
HDL: 31 mg/dL — ABNORMAL LOW (ref >40)
LDL Cholesterol: 119 mg/dL — ABNORMAL HIGH (ref 0–99)
Total CHOL/HDL Ratio: 7.1 RATIO
Triglycerides: 356 mg/dL — ABNORMAL HIGH (ref <150)
VLDL: 71 mg/dL — ABNORMAL HIGH (ref 0–40)

## 2020-11-03 LAB — COMPREHENSIVE METABOLIC PANEL
ALT: 32 U/L (ref 0–44)
AST: 23 U/L (ref 15–41)
Albumin: 4.2 g/dL (ref 3.5–5.0)
Alkaline Phosphatase: 50 U/L (ref 38–126)
Anion gap: 8 (ref 5–15)
BUN: 13 mg/dL (ref 6–20)
CO2: 25 mmol/L (ref 22–32)
Calcium: 8.9 mg/dL (ref 8.9–10.3)
Chloride: 103 mmol/L (ref 98–111)
Creatinine, Ser: 1.01 mg/dL (ref 0.61–1.24)
GFR, Estimated: >60 mL/min (ref >60)
Glucose, Bld: 88 mg/dL (ref 70–99)
Potassium: 3.7 mmol/L (ref 3.5–5.1)
Sodium: 136 mmol/L (ref 135–145)
Total Bilirubin: 1.4 mg/dL — ABNORMAL HIGH (ref 0.3–1.2)
Total Protein: 7 g/dL (ref 6.5–8.1)

## 2020-11-03 LAB — VITAMIN D 25 HYDROXY (VIT D DEFICIENCY, FRACTURES): Vit D, 25-Hydroxy: 26.88 ng/mL — ABNORMAL LOW (ref 30–100)

## 2020-11-03 LAB — TSH: TSH: 1.02 u[IU]/mL (ref 0.350–4.500)

## 2020-11-03 LAB — FERRITIN: Ferritin: 77 ng/mL (ref 24–336)

## 2020-11-03 LAB — HEMOGLOBIN A1C
Hgb A1c MFr Bld: 5.3 % (ref 4.8–5.6)
Mean Plasma Glucose: 105.41 mg/dL

## 2020-11-03 MED ORDER — ALUM & MAG HYDROXIDE-SIMETH 200-200-20 MG/5ML PO SUSP: 15.0000 mL | Freq: Four times a day (QID) | ORAL | Status: DC | PRN

## 2020-11-03 MED ORDER — HYDROXYZINE HCL 25 MG PO TABS
25.0000 mg | ORAL_TABLET | Freq: Four times a day (QID) | ORAL | Status: DC | PRN
  Administered 2020-11-07: 25 mg via ORAL
  Filled 2020-11-03: qty 1

## 2020-11-03 MED ORDER — TRAZODONE HCL 50 MG PO TABS
50.0000 mg | ORAL_TABLET | Freq: Every evening | ORAL | Status: DC | PRN
  Administered 2020-11-04 – 2020-11-06 (×3): 50 mg via ORAL
  Filled 2020-11-03 (×3): qty 1

## 2020-11-03 MED ORDER — IBUPROFEN 400 MG PO TABS
400.0000 mg | ORAL_TABLET | Freq: Two times a day (BID) | ORAL | Status: DC | PRN
  Administered 2020-11-03 – 2020-11-04 (×2): 400 mg via ORAL
  Filled 2020-11-03 (×2): qty 1

## 2020-11-03 MED ORDER — MAGNESIUM HYDROXIDE 400 MG/5ML PO SUSP: 5.0000 mL | Freq: Every day | ORAL | Status: DC | PRN

## 2020-11-03 NOTE — Progress Notes (Signed)
Patient's self inventory sheet, patient has poor sleep, no sleep medication.  Fair appetite, normal energy, good concentration.  Rated depression 8, hopeless 5, anxiety 2.  Denied withdrawals.  Denied SI.  Physical problems, headaches.  Goal is talk about rescinding 72 hr request for discharge.  Plans to stay in his room.  No discharge plans.

## 2020-11-03 NOTE — Progress Notes (Signed)
Adult Psychoeducational Group Note  Date:  11/03/2020 Time:  5:08 PM  Group Topic/Focus:  Developing a Wellness Toolbox:   The focus of this group is to help patients develop a "wellness toolbox" with skills and strategies to promote recovery upon discharge.  Participation Level:  Active  Participation Quality:  Appropriate  Affect:  Appropriate  Cognitive:  Alert  Insight: Good  Engagement in Group:  Engaged  Modes of Intervention:  Discussion and Education  Additional Comments:  Pt was able to attend group and participated well.  Krissia Schreier E 11/03/2020, 5:08 PM

## 2020-11-03 NOTE — Plan of Care (Signed)
Nurse discussed anxiety, depression and coping skills with patient.  

## 2020-11-03 NOTE — [facility] Group Notes (Signed)
CSW was unable to hold group today due to late discharges and acuity of the unit.   Billijo Dilling, LCSWA Clinicial Social Worker  Health 

## 2020-11-03 NOTE — Progress Notes (Signed)
Recreation Therapy Notes  Date: 8.29.22 Time: 0930 Location: [address] Dayroom  Group Topic: Stress Management   Goal Area(s) Addresses:  Patient will actively participate in stress management techniques presented during session.  Patient will successfully identify benefit of practicing stress management post d/c.   Intervention: Relaxation exercise with ambient sound and script   Activity: Guided Imagery. LRT provided education, instruction, and demonstration on practice of visualization via guided imagery. Patient was asked to participate in the technique introduced during session. Patients were given suggestions of ways to access scripts post d/c and encouraged to explore Youtube and other apps available on smartphones, tablets, and computers.  Education:  Stress Management, Discharge Planning.   Education Outcome: Acknowledges education  Clinical Observations/Feedback: Patient did not attend group session.   Caroll Rancher, LRT/CTRS         Caroll Rancher A 11/03/2020 10:54 AM

## 2020-11-03 NOTE — BH IP Treatment Plan (Signed)
Interdisciplinary Treatment and Diagnostic Plan Update  11/03/2020 Time of Session: 11:00am Jeffery Johnson MRN: [id number]  Principal Diagnosis: Severe episode of recurrent major depressive disorder, with psychotic features (HCC)  Secondary Diagnoses: Principal Problem:   Severe episode of recurrent major depressive disorder, with psychotic features (HCC) Active Problems:   Excessive daytime sleepiness   Insomnia due to mental condition   Hypersomnia with sleep apnea   Migraine   Anxiety   Current Medications:  Current Facility-Administered Medications  Medication Dose Route Frequency Provider Last Rate Last Admin   escitalopram (LEXAPRO) tablet 30 mg  30 mg Oral QHS Maurer, Sheila M, MD   30 mg at 11/02/20 2154   gabapentin (NEURONTIN) capsule 100 mg  100 mg Oral BID WC Maurer, Sheila M, MD   100 mg at 11/03/20 0811   gabapentin (NEURONTIN) capsule 400 mg  400 mg Oral QHS Maurer, Sheila M, MD   400 mg at 11/02/20 2152   hydrOXYzine (ATARAX/VISTARIL) tablet 50 mg  50 mg Oral TID PRN Maurer, Sheila M, MD   50 mg at 11/03/20 0811   pantoprazole (PROTONIX) EC tablet 40 mg  40 mg Oral BH-qamhs Maurer, Sheila M, MD   40 mg at 11/03/20 0811   risperiDONE (RISPERDAL) tablet 6 mg  6 mg Oral QHS Maurer, Sheila M, MD   6 mg at 11/02/20 2151   zolmitriptan (ZOMIG) nasal solution 1 spray  1 spray Nasal PRN Maurer, Sheila M, MD       zonisamide (ZONEGRAN) capsule 250 mg  250 mg Oral QHS Maurer, Sheila M, MD   250 mg at 11/02/20 2152   PTA Medications: Medications Prior to Admission  Medication Sig Dispense Refill Last Dose   vortioxetine HBr (TRINTELLIX) 10 MG TABS tablet Take 1 tablet (10 mg total) by mouth daily. For depression 30 tablet 0    clonazePAM (KLONOPIN) 0.5 MG tablet Take 1 tablet (0.5 mg total) by mouth at bedtime. For sleep 7 tablet 0    hydrOXYzine (ATARAX/VISTARIL) 25 MG tablet Take 1 tablet (25 mg total) by mouth 3 (three) times daily as needed for anxiety. 60 tablet 0     loratadine (CLARITIN) 10 MG tablet Take 1 tablet (10 mg total) by mouth daily. (May buy from over the counter): For smoking cessaion      omega-3 acid ethyl esters (LOVAZA) 1 g capsule Take 1 capsule (1 g total) by mouth 2 (two) times daily. For hyperglycemia (Patient not taking: Reported on 05/31/2018) 60 capsule 0    Prenatal Vit-Fe Fumarate-FA (PRENATAL MULTIVITAMIN) TABS tablet Take 1 tablet by mouth 2 (two) times daily. (May buy from over the counter): Vitamin supplement (Patient not taking: Reported on 05/31/2018)      risperiDONE (RISPERDAL) 1 MG tablet Take 1 tablet (1 mg total) by mouth at bedtime. For mood control 30 tablet 0    ZOMIG 5 MG nasal solution as needed.      zonisamide (ZONEGRAN) 100 MG capsule Take 2 capsules (200 mg total) by mouth at bedtime. For antipsychotic induced weight gain       Patient Stressors: Educational concerns Other: Patient's parents are going divorcing  Patient Strengths: Ability for insight Average or above average intelligence Communication skills Motivation for treatment/growth Supportive family/friends  Treatment Modalities: Medication Management, Group therapy, Case management,  1 to 1 session with clinician, Psychoeducation, Recreational therapy.   Physician Treatment Plan for Primary Diagnosis: Severe episode of recurrent major depressive disorder, with psychotic features (HCC) Long Term Goal(s):       Short Term Goals:    Medication Management: Evaluate patient's response, side effects, and tolerance of medication regimen.  Therapeutic Interventions: 1 to 1 sessions, Unit Group sessions and Medication administration.  Evaluation of Outcomes: Not Met  Physician Treatment Plan for Secondary Diagnosis: Principal Problem:   Severe episode of recurrent major depressive disorder, with psychotic features (HCC) Active Problems:   Excessive daytime sleepiness   Insomnia due to mental condition   Hypersomnia with sleep apnea   Migraine    Anxiety  Long Term Goal(s):     Short Term Goals:       Medication Management: Evaluate patient's response, side effects, and tolerance of medication regimen.  Therapeutic Interventions: 1 to 1 sessions, Unit Group sessions and Medication administration.  Evaluation of Outcomes: Not Met   RN Treatment Plan for Primary Diagnosis: Severe episode of recurrent major depressive disorder, with psychotic features (HCC) Long Term Goal(s): Knowledge of disease and therapeutic regimen to maintain health will improve  Short Term Goals: Ability to remain free from injury will improve, Ability to verbalize frustration and anger appropriately will improve, Ability to demonstrate self-control, Ability to identify and develop effective coping behaviors will improve, and Compliance with prescribed medications will improve  Medication Management: RN will administer medications as ordered by provider, will assess and evaluate patient's response and provide education to patient for prescribed medication. RN will report any adverse and/or side effects to prescribing provider.  Therapeutic Interventions: 1 on 1 counseling sessions, Psychoeducation, Medication administration, Evaluate responses to treatment, Monitor vital signs and CBGs as ordered, Perform/monitor CIWA, COWS, AIMS and Fall Risk screenings as ordered, Perform wound care treatments as ordered.  Evaluation of Outcomes: Not Met   LCSW Treatment Plan for Primary Diagnosis: Severe episode of recurrent major depressive disorder, with psychotic features (HCC) Long Term Goal(s): Safe transition to appropriate next level of care at discharge, Engage patient in therapeutic group addressing interpersonal concerns.  Short Term Goals: Engage patient in aftercare planning with referrals and resources, Increase social support, Increase ability to appropriately verbalize feelings, Identify triggers associated with mental health/substance abuse issues, and  Increase skills for wellness and recovery  Therapeutic Interventions: Assess for all discharge needs, 1 to 1 time with Social worker, Explore available resources and support systems, Assess for adequacy in community support network, Educate family and significant other(s) on suicide prevention, Complete Psychosocial Assessment, Interpersonal group therapy.  Evaluation of Outcomes: Not Met   Progress in Treatment: Attending groups: No. Participating in groups: No. Taking medication as prescribed: Yes. Toleration medication: Yes. Family/Significant other contact made: No, will contact:  if consent is provided Patient understands diagnosis: Yes. Discussing patient identified problems/goals with staff: Yes. Medical problems stabilized or resolved: Yes. Denies suicidal/homicidal ideation: Yes. Issues/concerns per patient self-inventory: No.   New problem(s) identified: No, Describe:  none  New Short Term/Long Term Goal(s): medication stabilization, elimination of SI thoughts, development of comprehensive mental wellness plan.    Patient Goals:  "To be able to get out"  Discharge Plan or Barriers: Patient recently admitted. CSW will continue to follow and assess for appropriate referrals and possible discharge planning.    Reason for Continuation of Hospitalization: Anxiety Depression Suicidal ideation  Estimated Length of Stay: 3-5 days   Scribe for Treatment Team:  A , LCSW 11/03/2020 11:15 AM 

## 2020-11-03 NOTE — Group Note (Signed)
Occupational Therapy Group Note  Group Topic:Feelings Management  Group Date: 11/03/2020 Start Time: 1400 End Time: 1450 Facilitators: Donne Hazel, OT   Group Description:  Group encouraged increased engagement and participation through discussion focused on Building Happiness. Patients were provided a handout and reviewed therapeutic strategies to build happiness including identifying gratitudes, random acts of kindness, exercise, meditation, positive journaling, and fostering relationships. Patients engaged in discussion and encouraged to reflect on each strategy and their experiences.  Therapeutic Goal(s): Identify strategies to build happiness. Identify and implement therapeutic strategies to improve overall mood. Practice and identify gratitudes, random acts of kindness, exercise, meditation, positive journaling, and fostering relationships  Participation Level: Active   Participation Quality: Independent   Behavior: Calm and Cooperative   Speech/Thought Process: Focused   Affect/Mood: Euthymic   Insight: Fair   Judgement: Moderate   Individualization: Jeffery Johnson was active in their participation of group discussion/activity. Pt identified "learning chemistry, my fiance, and my cats" as things that currently bring him happiness. Appeared open and receptive to handout/strategies provided.   Modes of Intervention: Discussion and Education  Patient Response to Interventions:  Attentive, Engaged, Interested , and Receptive   Plan: Continue to engage patient in OT groups 2 - 3x/week.  11/03/2020  Donne Hazel, OT
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
## 2020-11-03 NOTE — Progress Notes (Signed)
[facility] MD Progress Note  11/03/2020 5:56 PM Jeffery Johnson  MRN:  [id number] Subjective:   Jeffery Johnson is a 25 year old male with a PPHx of MDD with psychotic features and GAD presenting voluntarily with SI and AH.   The patient's chart was reviewed and nursing notes were reviewed. Over the past 24 hrs, there were no documented behavioral issues, no PRN medications given for agitation.The patient's case was discussed in multidisciplinary team meeting.   On interview this morning, the patient is alert and oriented with a logical and linear thought process.  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  He is able to accurately relate some knowledge from organic chemistry.  He reports last hearing auditory hallucinations last night, which she reports as indistinct voices chattering.  He reports that these voices were more distinct before he was started on Risperdal during his last hospitalization.  When asked about the fact that he signed a 72-hour form, the patient reports that he wants to leave the [facility] as soon as possible so that he can go to his sleep study on the 2nd of September.  He reports being unable to drive until he completes the sleep study. He reports the sleep study is being done through [facility], but there is no record available in his chart. He gives permission for collateral contact.  He denies suicidal ideation and visual hallucinations.  Review of systems is as below.  Principal Problem: Severe episode of recurrent major depressive disorder, with psychotic features (HCC) Diagnosis: Principal Problem:   Severe episode of recurrent major depressive disorder, with psychotic features (HCC) Active Problems:   Excessive daytime sleepiness   Insomnia due to mental condition   Hypersomnia with sleep apnea   Migraine   Anxiety   MDD (major depressive disorder), recurrent severe, without psychosis (HCC)  Total Time Spent in Direct Patient  Care: I personally spent 30 minutes on the unit in direct patient care. The direct patient care time included face-to-face time with the patient, reviewing the patient's chart, communicating with other professionals, and coordinating care. Greater than 50% of this time was spent in counseling or coordinating care with the patient regarding goals of hospitalization, psycho-education, and discharge planning needs.   Past Psychiatric History: Previous admission in 2020 for AVH and SI with plan to slit wrists with a knife (never actually done). Admission H and P reports self-mutilation previously, unclear if this occurred.   Past Medical History:  Past Medical History:  Diagnosis Date   ADHD (attention deficit hyperactivity disorder)    Anxiety    Depression    Excessive daytime sleepiness 07/05/2014   Insomnia due to mental condition 07/05/2014   Migraine    Seasonal allergies    History reviewed. No pertinent surgical history. Family History:  Family History  Problem Relation Age of Onset   Breast cancer Mother    Skin cancer Mother    Stroke Maternal Grandfather    Heart disease Maternal Grandfather    Diabetes Maternal Grandfather    Stroke Paternal Grandfather    Heart disease Paternal Grandfather    Seizures Maternal Grandmother    Seizures Cousin        mothers side    Family Psychiatric  History: Patient's father apparently has been diagnosed with a bipolar type condition, he had an uncle who committed suicide and the mother has been treated for depression.    Sleep: Fair  Appetite:  Fair  Current Medications: Current Facility-Administered Medications  Medication Dose Route Frequency Provider Last Rate Last Admin   escitalopram (LEXAPRO) tablet 30 mg  30 mg Oral QHS Mariel CraftMaurer, Sheila M, MD   30 mg at 11/02/20 2154   gabapentin (NEURONTIN) capsule 100 mg  100 mg Oral BID WC Mariel CraftMaurer, Sheila M, MD   100 mg at 11/03/20 1130   gabapentin (NEURONTIN) capsule 400 mg  400 mg Oral QHS  Mariel CraftMaurer, Sheila M, MD   400 mg at 11/02/20 2152   hydrOXYzine (ATARAX/VISTARIL) tablet 50 mg  50 mg Oral TID PRN Mariel CraftMaurer, Sheila M, MD   50 mg at 11/03/20 [id number]   ibuprofen (ADVIL) tablet 400 mg  400 mg Oral BID PRN Carlyn ReichertGabrielle, Stevon Gough, MD   400 mg at 11/03/20 1313   pantoprazole (PROTONIX) EC tablet 40 mg  40 mg Oral Bobby RumpfBH-qamhs Maurer, Sheila M, MD   40 mg at 11/03/20 [id number]   risperiDONE (RISPERDAL) tablet 6 mg  6 mg Oral QHS Mariel CraftMaurer, Sheila M, MD   6 mg at 11/02/20 2151   zolmitriptan (ZOMIG) nasal solution 1 spray  1 spray Nasal PRN Mariel CraftMaurer, Sheila M, MD       zonisamide [facility](ZONEGRAN) capsule 250 mg  250 mg Oral QHS Mariel CraftMaurer, Sheila M, MD   250 mg at 11/02/20 2152    Lab Results:  Results for orders placed or performed during the [facility] encounter of 11/02/20 (from the past 48 hour(s))  Resp Panel by RT-PCR (Flu A&B, Covid) Nasopharyngeal Swab     Status: None   Collection Time: 11/02/20  4:32 PM   Specimen: Nasopharyngeal Swab; Nasopharyngeal(NP) swabs in vial transport medium  Result Value Ref Range   SARS Coronavirus 2 by RT PCR NEGATIVE NEGATIVE    Comment: (NOTE) SARS-CoV-2 target nucleic acids are NOT DETECTED.  The SARS-CoV-2 RNA is generally detectable in upper respiratory specimens during the acute phase of infection. The lowest concentration of SARS-CoV-2 viral copies this assay can detect is 138 copies/mL. A negative result does not preclude SARS-Cov-2 infection and should not be used as the sole basis for treatment or other patient management decisions. A negative result may occur with  improper specimen collection/handling, submission of specimen other than nasopharyngeal swab, presence of viral mutation(s) within the areas targeted by this assay, and inadequate number of viral copies(<138 copies/mL). A negative result must be combined with clinical observations, patient history, and epidemiological information. The expected result is Negative.  Fact Sheet for Patients:   BloggerCourse.comhttps://www.fda.gov/media/152166/download  Fact Sheet for Healthcare Providers:  SeriousBroker.ithttps://www.fda.gov/media/152162/download  This test is no t yet approved or cleared by the [place] FDA and  has been authorized for detection and/or diagnosis of SARS-CoV-2 by FDA under an Emergency Use Authorization (EUA). This EUA will remain  in effect (meaning this test can be used) for the duration of the COVID-19 declaration under Section 564(b)(1) of the Act, 21 U.S.C.section 360bbb-3(b)(1), unless the authorization is terminated  or revoked sooner.       Influenza A by PCR NEGATIVE NEGATIVE   Influenza B by PCR NEGATIVE NEGATIVE    Comment: (NOTE) The Xpert Xpress SARS-CoV-2/FLU/RSV plus assay is intended as an aid in the diagnosis of influenza from Nasopharyngeal swab specimens and should not be used as a sole basis for treatment. Nasal washings and aspirates are unacceptable for Xpert Xpress SARS-CoV-2/FLU/RSV testing.  Fact Sheet for Patients: BloggerCourse.comhttps://www.fda.gov/media/152166/download  Fact Sheet for Healthcare Providers: SeriousBroker.ithttps://www.fda.gov/media/152162/download  This test is not yet approved or cleared by the [place] FDA and has been authorized for detection and/or diagnosis of SARS-CoV-2 by FDA under an Emergency Use Authorization (EUA). This EUA will remain in effect (meaning this test can be used) for the duration of the COVID-19 declaration under Section 564(b)(1) of the Act, 21 U.S.C. section 360bbb-3(b)(1), unless the authorization is terminated or revoked.  Performed at [facility], [address] [place], [place] [address]   Rapid urine drug screen ([facility] performed)     Status: None   Collection Time: 11/02/20 11:39 PM  Result Value Ref Range   Opiates NONE DETECTED NONE DETECTED   Cocaine NONE DETECTED NONE DETECTED   Benzodiazepines NONE DETECTED NONE DETECTED   Amphetamines NONE DETECTED NONE DETECTED   Tetrahydrocannabinol  NONE DETECTED NONE DETECTED   Barbiturates NONE DETECTED NONE DETECTED    Comment: (NOTE) DRUG SCREEN FOR MEDICAL PURPOSES ONLY.  IF CONFIRMATION IS NEEDED FOR ANY PURPOSE, NOTIFY LAB WITHIN 5 DAYS.  LOWEST DETECTABLE LIMITS FOR URINE DRUG SCREEN Drug Class                     Cutoff (ng/mL) Amphetamine and metabolites    1000 Barbiturate and metabolites    200 Benzodiazepine                 200 Tricyclics and metabolites     300 Opiates and metabolites        300 Cocaine and metabolites        300 THC                            50 Performed at [facility], [address], [place], [place] [address]   CBC with Differential/Platelet     Status: None   Collection Time: 11/03/20  6:28 AM  Result Value Ref Range   WBC 7.8 4.0 - 10.5 K/uL   RBC 5.15 4.22 - 5.81 MIL/uL   Hemoglobin 15.7 13.0 - 17.0 g/dL   HCT 09.8 11.9 - 14.7 %   MCV 87.0 80.0 - 100.0 fL   MCH 30.5 26.0 - 34.0 pg   MCHC 35.0 30.0 - 36.0 g/dL   RDW 82.9 56.2 - 13.0 %   Platelets 281 150 - 400 K/uL   nRBC 0.0 0.0 - 0.2 %   Neutrophils Relative % 43 %   Neutro Abs 3.3 1.7 - 7.7 K/uL   Lymphocytes Relative 42 %   Lymphs Abs 3.4 0.7 - 4.0 K/uL   Monocytes Relative 9 %   Monocytes Absolute 0.7 0.1 - 1.0 K/uL   Eosinophils Relative 4 %   Eosinophils Absolute 0.3 0.0 - 0.5 K/uL   Basophils Relative 1 %   Basophils Absolute 0.0 0.0 - 0.1 K/uL   Immature Granulocytes 1 %   Abs Immature Granulocytes 0.04 0.00 - 0.07 K/uL    Comment: Performed at [facility], [address], [place], [place] [address]  Comprehensive metabolic panel     Status: Abnormal   Collection Time: 11/03/20  6:28 AM  Result Value Ref Range   Sodium 136 135 - 145 mmol/L   Potassium 3.7 3.5 - 5.1 mmol/L   Chloride 103 98 - 111 mmol/L   CO2 25 22 - 32 mmol/L   Glucose, Bld 88 70 - 99 mg/dL    Comment: Glucose reference range applies only to samples taken after fasting for at least 8 hours.   BUN 13 6 -  20 mg/dL   Creatinine, Ser 2.48 0.61 - 1.24 mg/dL   Calcium 8.9 8.9 - 25.0 mg/dL   Total Protein 7.0 6.5 - 8.1 g/dL   Albumin 4.2 3.5 - 5.0 g/dL   AST 23 15 - 41 U/L   ALT 32 0 - 44 U/L   Alkaline Phosphatase 50 38 - 126 U/L   Total Bilirubin 1.4 (H) 0.3 - 1.2 mg/dL   GFR, Estimated >03 >70 mL/min    Comment: (NOTE) Calculated using the CKD-EPI Creatinine Equation (2021)    Anion gap 8 5 - 15    Comment: Performed at [facility], [address], [place], [place] [address]  Hemoglobin A1c     Status: None   Collection Time: 11/03/20  6:28 AM  Result Value Ref Range   Hgb A1c MFr Bld 5.3 4.8 - 5.6 %    Comment: (NOTE) Pre diabetes:          5.7%-6.4%  Diabetes:              >6.4%  Glycemic control for   <7.0% adults with diabetes    Mean Plasma Glucose 105.41 mg/dL    Comment: Performed at [facility], [address] [place], [place] [address]  Lipid panel     Status: Abnormal   Collection Time: 11/03/20  6:28 AM  Result Value Ref Range   Cholesterol 221 (H) 0 - 200 mg/dL   Triglycerides 038 (H) <150 mg/dL   HDL 31 (L) >88 mg/dL   Total CHOL/HDL Ratio 7.1 RATIO   VLDL 71 (H) 0 - 40 mg/dL   LDL Cholesterol 280 (H) 0 - 99 mg/dL    Comment:        Total Cholesterol/HDL:CHD Risk Coronary Heart Disease Risk Table                     Men   Women  1/2 Average Risk   3.4   3.3  Average Risk       5.0   4.4  2 X Average Risk   9.6   7.1  3 X Average Risk  23.4   11.0        Use the calculated Patient Ratio above and the CHD Risk Table to determine the patient's CHD Risk.        ATP III CLASSIFICATION (LDL):  <100     mg/dL   Optimal  034-917  mg/dL   Near or Above                    Optimal  130-159  mg/dL   Borderline  915-056  mg/dL   High  >979     mg/dL   Very High Performed at [facility], [address] [place], [place] [address]   TSH     Status: None   Collection Time: 11/03/20  6:28 AM  Result Value Ref Range    TSH 1.020 0.350 - 4.500 uIU/mL    Comment: Performed by a 3rd Generation assay with a functional sensitivity of <=0.01 uIU/mL. Performed at [facility], [address], [place], [place] [address]   Ferritin     Status: None   Collection Time: 11/03/20  6:28 AM  Result Value Ref Range   Ferritin 77 24 - 336 ng/mL    Comment: Performed at [facility], [address], [place], [place] [address]  VITAMIN D 25 Hydroxy (  Vit-D Deficiency, Fractures)     Status: Abnormal   Collection Time: 11/03/20  6:28 AM  Result Value Ref Range   Vit D, 25-Hydroxy 26.88 (L) 30 - 100 ng/mL    Comment: (NOTE) Vitamin D deficiency has been defined by the Institute of Medicine  and an Endocrine Society practice guideline as a level of serum 25-OH  vitamin D less than 20 ng/mL (1,2). The Endocrine Society went on to  further define vitamin D insufficiency as a level between 21 and 29  ng/mL (2).  1. IOM (Institute of Medicine). 2010. Dietary reference intakes for  calcium and D. [place]: [employer]. 2. Holick MF, Binkley Watson, Bischoff-Ferrari HA, et al. Evaluation,  treatment, and prevention of vitamin D deficiency: an Endocrine  Society clinical practice guideline, JCEM. 2011 Jul; 96(7): 1911-30.  Performed at [facility], [address] [place], [place] [address]     Blood Alcohol level:  Lab Results  Component Value Date   ETH <10 03/21/2018    Metabolic Disorder Labs: Lab Results  Component Value Date   HGBA1C 5.3 11/03/2020   MPG 105.41 11/03/2020   MPG 96.8 03/22/2018   No results found for: PROLACTIN Lab Results  Component Value Date   CHOL 221 (H) 11/03/2020   TRIG 356 (H) 11/03/2020   HDL 31 (L) 11/03/2020   CHOLHDL 7.1 11/03/2020   VLDL 71 (H) 11/03/2020   LDLCALC 119 (H) 11/03/2020   LDLCALC 186 (H) 03/22/2018    Physical Findings: AIMS: 0, no cogwheeling or rigidity, no tremor or  bradykinesia   Musculoskeletal: Strength & Muscle Tone: within normal limits Gait & Station:  NA Patient leans: NA  Psychiatric Specialty Exam:  Presentation  General Appearance:  Appropriate for Environment Eye Contact: Good Speech: Clear and Coherent Speech Volume: Normal Handedness: No data recorded  Mood and Affect  Mood: Euthymic Affect: Constricted  Thought Process  Thought Processes: Coherent; Linear; Goal Directed Descriptions of Associations:Intact Orientation:Full (Time, Place and Person) Thought Content:Logical History of Schizophrenia/Schizoaffective disorder:No  Duration of Psychotic Symptoms:Greater than six months  Hallucinations:Hallucinations: Auditory Description of Auditory Hallucinations: chatter at night Ideas of Reference:None Suicidal Thoughts:Suicidal Thoughts: No Homicidal Thoughts:Homicidal Thoughts: No  Sensorium  Memory: Immediate Fair; Recent Fair; Remote Fair Judgment: Fair Insight: Fair  Art therapist  Concentration: Good Attention Span: Good Recall: Good Fund of Knowledge: Good Language: Good  Psychomotor Activity  Psychomotor Activity: Psychomotor Activity: Normal  Assets  Assets: Physical Health; Social Support; Talents/Skills; Transportation; Vocational/Educational  Sleep  Sleep: Sleep: Fair Number of Hours of Sleep: 7.25   Physical Exam: Physical Exam Vitals and nursing note reviewed.  HENT:     Head: Normocephalic and atraumatic.  Pulmonary:     Effort: Pulmonary effort is normal.  Neurological:     General: No focal deficit present.     Mental Status: He is alert and oriented to person, place, and time.   Review of Systems  Respiratory:  Negative for shortness of breath.   Cardiovascular:  Negative for chest pain.  Gastrointestinal:  Negative for diarrhea, nausea and vomiting.  Neurological:  Negative for headaches.  Blood pressure 111/76, pulse 91, temperature 97.8 F (36.6 C),  temperature source Oral, resp. rate 18, SpO2 97 %. There is no height or weight on file to calculate BMI.   Treatment Plan Summary: Daily contact with patient to assess and evaluate symptoms and progress in treatment and Medication management  Safety and Monitoring -- VOLUNTARY admission to inpatient  psychiatric unit for safety, stabilization and treatment -- Daily contact with patient to assess and evaluate symptoms and progress in treatment -- Patient's case to be discussed in multi-disciplinary team meeting -- Observation Level : q15 minute checks -- Vital signs:  q12 hours -- Precautions: suicide  Depression and SI in the context of psychotic symptoms  Patient reports longstanding depression in the context of years of low intensity AVH that are not mood congruent. Concern for developing primary psychotic disorder.  -Continue Risperdal 6 mg QHS (Per patient, home dose is 4 mg).  Antipsychotic labs  EKG: pending  Lipids: chol 221, TG 356, HDL 31, LDL 119  A1C: 5.3 AIMS: 0, no cogwheeling or rigidity, no tremor or bradykinesia (8/29) -Continue Lexapro 30 mg (patient was taking 20 mg PTA).   Medical Management Covid negative CMP: unremarkable CBC: WNL EtOH: <10 UDS: negative TSH: 1.0 A1C: 5.3 Lipids: as above (will require outpatient management)  Continue Zonisamide 250 mg daily for migraines, and Zomig PRN. Protonix 40 mg BID for GERD Gabapentin 100/100/400 for gagging/vomiting  Continue PRN's: Ibuprofen, Maalox, Atarax, Milk of Magnesia, Trazodone  Dispo: home/self care, talk with LCSW about moving sleep study date  Carlyn Reichert PGY-1, Psychiatry

## 2020-11-03 NOTE — Progress Notes (Signed)
Psychoeducational Group Note  Date:  11/03/2020 Time:  0900  Group Topic/Focus:  Goals Group:   The focus of this group is to help patients establish daily goals to achieve during treatment and discuss how the patient can incorporate goal setting into their daily lives to aide in recovery.  Participation Level: Did Not Attend  Participation Quality:  Not Applicable  Affect:  Not Applicable  Cognitive:  Not Applicable  Insight:  Not Applicable  Engagement in Group: Not Applicable  Additional Comments:  Pt refused to attend group this morning.  Aarron Wierzbicki E 11/03/2020, 8:59 AM

## 2020-11-03 NOTE — B[facility] Group Notes (Signed)
Patient did not attend group.    Spiritual care group on grief and loss facilitated by chaplain Katy Francessca Friis, B[facility]   Group Goal:   Support / Education around grief and loss   Members engage in facilitated group support and psycho-social education.   Group Description:   Following introductions and group rules, group members engaged in facilitated group dialog and support around topic of loss, with particular support around experiences of loss in their lives. Group Identified types of loss (relationships / self / things) and identified patterns, circumstances, and changes that precipitate losses. Reflected on thoughts / feelings around loss, normalized grief responses, and recognized variety in grief experience. Group noted Worden's four tasks of grief in discussion.   Group drew on Adlerian / Rogerian, narrative, MI,    

## 2020-11-03 NOTE — Progress Notes (Signed)
Adult Psychoeducational Group Note  Date:  11/03/2020 Time:  9:23 PM  Group Topic/Focus:  Wrap-Up Group:   The focus of this group is to help patients review their daily goal of treatment and discuss progress on daily workbooks.  Participation Level:  Active  Participation Quality:  Appropriate and Attentive  Affect:  Appropriate  Cognitive:  Alert and Appropriate  Insight: Appropriate  Engagement in Group:  Engaged  Modes of Intervention:  Discussion  Additional Comments: The patient participated in the NA Group on this date with volunteers on how to decrease dependence on relationships while beginning to meet their own needs, build confidence, and practice assertiveness--discussed how to demonstrate healthy communication that is honest and self-disclosing. Provided supportive therapy focusing on relationship and trust building--end of Wrap-Up progress report for the NA support group.   Nicoletta Dress 11/03/2020, 9:23 PM

## 2020-11-03 NOTE — Progress Notes (Signed)
D:  Patient denied HI.  Patient denied A/V hallucinations.  Does have SI thoughts when he sees a knife laying around for the past  one week.  Patient requested ibuprofen for headaches.  Stated he would have family bring his zomig nasal spray in for his use.   A:  Medications administered per MD orders.  Emotional support and encouragement given patient. R:  Safety maintained with 15 minute checks.

## 2020-11-03 NOTE — Plan of Care (Signed)
Patient has been in the milieu with peers. Pleasant on approach. Alert and oriented x 4. Denying SI/HI. Reports hearing some voices "from another house". Patient reports "they are not as bad as they were...". Patient reports that he had a good day. Reports that medications are working well for him. Reports that he has good support system as well as employment. Mother and girlfriend are his primary support. Reports that e visited with girlfriend today and "everything went well". Has no sign of distress. Patient attended group, had a snack and received HS medications. Support and encouragements provided. Safety precautions maintained.

## 2020-11-04 DIAGNOSIS — F333 Major depressive disorder, recurrent, severe with psychotic symptoms: Secondary | ICD-10-CM | POA: Diagnosis not present

## 2020-11-04 MED ORDER — VITAMIN D (ERGOCALCIFEROL) 1.25 MG (50000 UNIT) PO CAPS
50000.0000 [IU] | ORAL_CAPSULE | ORAL | Status: DC
  Administered 2020-11-05: 50000 [IU] via ORAL
  Filled 2020-11-04: qty 1

## 2020-11-04 MED ORDER — BENZTROPINE MESYLATE 1 MG PO TABS: 1.0000 mg | ORAL_TABLET | Freq: Two times a day (BID) | ORAL | Status: DC | PRN

## 2020-11-04 NOTE — Plan of Care (Signed)
Active in the milieu, pleasant and cooperative. Denies SI/HI/AVH. Affect is improved. Patient is less anxious and more involved in group activities. Has no sign of distress.

## 2020-11-04 NOTE — Progress Notes (Signed)
Recreation Therapy Notes  Animal-Assisted Activity (AAA) Program Checklist/Progress Notes Patient Eligibility Criteria Checklist & Daily Group note for Rec Tx Intervention  Date: 8.30.22 Time: 1430 Location: [address]  AAA/T Program Assumption of Risk Form signed by Engineer, production or Parent Legal Guardian  YES   Patient is free of allergies or severe asthma  YES   Patient reports no fear of animals YES  Patient reports no history of cruelty to animals YES  Patient understands his/her participation is voluntary YES   Patient washes hands before animal contact YES   Patient washes hands after animal contact YES   Behavioral Response: Attentive  Education: Charity fundraiser, Appropriate Animal Interaction   Education Outcome: Acknowledges understanding/In group clarification offered/Needs additional education.   Clinical Observations/Feedback: Pt attended and participated in activity.    Caroll Rancher, LRT/CTRS        Caroll Rancher A 11/04/2020 3:48 PM

## 2020-11-04 NOTE — Progress Notes (Signed)
Patient slept throughout the night and had no sign of discomfort. Got up for AM routine. He is pleasant and cooperative. Went to [employer] for breakfast: no major concern throughout the shift.

## 2020-11-04 NOTE — Progress Notes (Signed)
Pt is alert and oriented to person, place, time and situation. Pt is calm, cooperative, pleasant, denies SI/HI/AVH, denies feelings of depression and anxiety. Pt is visible, attends groups, affect is flat, mediation complaint. Will continue to monitor pt per Q15 minute face checks and monitor for safety and progress.

## 2020-11-04 NOTE — Progress Notes (Addendum)
[facility] MD Progress Note  11/04/2020 2:03 PM Jeffery Johnson  MRN:  [id number] Subjective:   Jeffery Johnson is a 25 year old male with a PPHx of MDD with psychotic features and GAD presenting voluntarily with SI and AH.   The patient's chart was reviewed and nursing notes were reviewed. Over the past 24 hrs, there were no documented behavioral issues, no PRN medications given for agitation.The patient's case was discussed in multidisciplinary team meeting.   On interview this morning, patient reports his mood is good.  Despite this, his affect is still constricted.  He reports last hearing auditory hallucinations yesterday.  He reports improvement in the intensity of the auditory hallucinations with the increased dose of Risperdal. When asked more about the events that led to his hospitalization, he reports that his suicidal ideation became overwhelming and "I became tired of looking at knives and having suicidal thoughts".  Per report from licensed clinical social worker patient was asking for scissors to "test himself".  When this idea was rejected the patient then asked to see pictures of a knife on the social worker's cell phone.  When asked if he will rescind his 72-hour request form, patient reports that he will.  Further discussed with patient concern for him developing a primary psychotic disorder such as schizophrenia.  Patient was receptive to this information and said that he had already been thinking about the diagnosis of schizophrenia.  He denies current suicidal ideation, homicidal ideation, and visual hallucinations.  Review of systems is negative as below.  Patient reports that his gagging has improved significantly.  Principal Problem: Severe episode of recurrent major depressive disorder, with psychotic features (HCC) Diagnosis: Principal Problem:   Severe episode of recurrent major depressive disorder, with psychotic features (HCC) Active Problems:   Excessive daytime sleepiness   Insomnia  due to mental condition   Hypersomnia with sleep apnea   Migraine   Anxiety    Total Time Spent in Direct Patient Care: I personally spent 30 minutes on the unit in direct patient care. The direct patient care time included face-to-face time with the patient, reviewing the patient's chart, communicating with other professionals, and coordinating care. Greater than 50% of this time was spent in counseling or coordinating care with the patient regarding goals of hospitalization, psycho-education, and discharge planning needs.   Past Psychiatric History: Previous admission in 2020 for AVH and SI with plan to slit wrists with a knife (never actually done). Admission H and P reports self-mutilation previously, unclear if this occurred.   Past Medical History:  Past Medical History:  Diagnosis Date   ADHD (attention deficit hyperactivity disorder)    Anxiety    Depression    Excessive daytime sleepiness 07/05/2014   Insomnia due to mental condition 07/05/2014   Migraine    Seasonal allergies    History reviewed. No pertinent surgical history. Family History:  Family History  Problem Relation Age of Onset   Breast cancer Mother    Skin cancer Mother    Stroke Maternal Grandfather    Heart disease Maternal Grandfather    Diabetes Maternal Grandfather    Stroke Paternal Grandfather    Heart disease Paternal Grandfather    Seizures Maternal Grandmother    Seizures Cousin        mothers side    Family Psychiatric  History: Patient's father apparently has been diagnosed with a bipolar type condition, he had an uncle who committed suicide and the mother has been treated for depression.  Sleep: Fair  Appetite:  Fair  Current Medications: Current Facility-Administered Medications  Medication Dose Route Frequency Provider Last Rate Last Admin   alum & mag hydroxide-simeth (MAALOX/MYLANTA) 200-200-20 MG/5ML suspension 15 mL  15 mL Oral Q6H PRN Carlyn Reichert, MD       escitalopram  (LEXAPRO) tablet 30 mg  30 mg Oral QHS Mariel Craft, MD   30 mg at 11/03/20 2135   gabapentin (NEURONTIN) capsule 100 mg  100 mg Oral BID WC Mariel Craft, MD   100 mg at 11/04/20 1356   gabapentin (NEURONTIN) capsule 400 mg  400 mg Oral QHS Mariel Craft, MD   400 mg at 11/03/20 2135   hydrOXYzine (ATARAX/VISTARIL) tablet 25 mg  25 mg Oral Q6H PRN Carlyn Reichert, MD       ibuprofen (ADVIL) tablet 400 mg  400 mg Oral BID PRN Carlyn Reichert, MD   400 mg at 11/03/20 1313   magnesium hydroxide (MILK OF MAGNESIA) suspension 5 mL  5 mL Oral Daily PRN Carlyn Reichert, MD       pantoprazole (PROTONIX) EC tablet 40 mg  40 mg Oral BH-qamhs Mariel Craft, MD   40 mg at 11/04/20 0801   risperiDONE (RISPERDAL) tablet 6 mg  6 mg Oral QHS Mariel Craft, MD   6 mg at 11/03/20 2135   traZODone (DESYREL) tablet 50 mg  50 mg Oral QHS PRN Carlyn Reichert, MD       zolmitriptan (ZOMIG) nasal solution 1 spray  1 spray Nasal PRN Mariel Craft, MD       zonisamide [facility]) capsule 250 mg  250 mg Oral QHS Mariel Craft, MD   250 mg at 11/03/20 2135    Lab Results:  Results for orders placed or performed during the hospital encounter of 11/02/20 (from the past 48 hour(s))  Resp Panel by RT-PCR (Flu A&B, Covid) Nasopharyngeal Swab     Status: None   Collection Time: 11/02/20  4:32 PM   Specimen: Nasopharyngeal Swab; Nasopharyngeal(NP) swabs in vial transport medium  Result Value Ref Range   SARS Coronavirus 2 by RT PCR NEGATIVE NEGATIVE    Comment: (NOTE) SARS-CoV-2 target nucleic acids are NOT DETECTED.  The SARS-CoV-2 RNA is generally detectable in upper respiratory specimens during the acute phase of infection. The lowest concentration of SARS-CoV-2 viral copies this assay can detect is 138 copies/mL. A negative result does not preclude SARS-Cov-2 infection and should not be used as the sole basis for treatment or other patient management decisions. A negative result may occur with   improper specimen collection/handling, submission of specimen other than nasopharyngeal swab, presence of viral mutation(s) within the areas targeted by this assay, and inadequate number of viral copies(<138 copies/mL). A negative result must be combined with clinical observations, patient history, and epidemiological information. The expected result is Negative.  Fact Sheet for Patients:  BloggerCourse.com  Fact Sheet for Healthcare Providers:  SeriousBroker.it  This test is no t yet approved or cleared by the [place] FDA and  has been authorized for detection and/or diagnosis of SARS-CoV-2 by FDA under an Emergency Use Authorization (EUA). This EUA will remain  in effect (meaning this test can be used) for the duration of the COVID-19 declaration under Section 564(b)(1) of the Act, 21 U.S.C.section 360bbb-3(b)(1), unless the authorization is terminated  or revoked sooner.       Influenza A by PCR NEGATIVE NEGATIVE   Influenza B by PCR NEGATIVE NEGATIVE  Comment: (NOTE) The Xpert Xpress SARS-CoV-2/FLU/RSV plus assay is intended as an aid in the diagnosis of influenza from Nasopharyngeal swab specimens and should not be used as a sole basis for treatment. Nasal washings and aspirates are unacceptable for Xpert Xpress SARS-CoV-2/FLU/RSV testing.  Fact Sheet for Patients: BloggerCourse.com  Fact Sheet for Healthcare Providers: SeriousBroker.it  This test is not yet approved or cleared by the [place] FDA and has been authorized for detection and/or diagnosis of SARS-CoV-2 by FDA under an Emergency Use Authorization (EUA). This EUA will remain in effect (meaning this test can be used) for the duration of the COVID-19 declaration under Section 564(b)(1) of the Act, 21 U.S.C. section 360bbb-3(b)(1), unless the authorization is terminated or revoked.  Performed at  [facility], [address], [place], [place] [address]   Rapid urine drug screen (hospital performed)     Status: None   Collection Time: 11/02/20 11:39 PM  Result Value Ref Range   Opiates NONE DETECTED NONE DETECTED   Cocaine NONE DETECTED NONE DETECTED   Benzodiazepines NONE DETECTED NONE DETECTED   Amphetamines NONE DETECTED NONE DETECTED   Tetrahydrocannabinol NONE DETECTED NONE DETECTED   Barbiturates NONE DETECTED NONE DETECTED    Comment: (NOTE) DRUG SCREEN FOR MEDICAL PURPOSES ONLY.  IF CONFIRMATION IS NEEDED FOR ANY PURPOSE, NOTIFY LAB WITHIN 5 DAYS.  LOWEST DETECTABLE LIMITS FOR URINE DRUG SCREEN Drug Class                     Cutoff (ng/mL) Amphetamine and metabolites    1000 Barbiturate and metabolites    200 Benzodiazepine                 200 Tricyclics and metabolites     300 Opiates and metabolites        300 Cocaine and metabolites        300 THC                            50 Performed at [facility], [address], [place], [place] [address]   CBC with Differential/Platelet     Status: None   Collection Time: 11/03/20  6:28 AM  Result Value Ref Range   WBC 7.8 4.0 - 10.5 K/uL   RBC 5.15 4.22 - 5.81 MIL/uL   Hemoglobin 15.7 13.0 - 17.0 g/dL   HCT 81.2 75.1 - 70.0 %   MCV 87.0 80.0 - 100.0 fL   MCH 30.5 26.0 - 34.0 pg   MCHC 35.0 30.0 - 36.0 g/dL   RDW 17.4 94.4 - 96.7 %   Platelets 281 150 - 400 K/uL   nRBC 0.0 0.0 - 0.2 %   Neutrophils Relative % 43 %   Neutro Abs 3.3 1.7 - 7.7 K/uL   Lymphocytes Relative 42 %   Lymphs Abs 3.4 0.7 - 4.0 K/uL   Monocytes Relative 9 %   Monocytes Absolute 0.7 0.1 - 1.0 K/uL   Eosinophils Relative 4 %   Eosinophils Absolute 0.3 0.0 - 0.5 K/uL   Basophils Relative 1 %   Basophils Absolute 0.0 0.0 - 0.1 K/uL   Immature Granulocytes 1 %   Abs Immature Granulocytes 0.04 0.00 - 0.07 K/uL    Comment: Performed at [facility], [address],  [place], [place] [address]  Comprehensive metabolic panel     Status: Abnormal   Collection Time: 11/03/20  6:28 AM  Result Value Ref Range   Sodium 136 135 - 145 mmol/L   Potassium 3.7 3.5 - 5.1 mmol/L   Chloride 103 98 - 111 mmol/L   CO2 25 22 - 32 mmol/L   Glucose, Bld 88 70 - 99 mg/dL    Comment: Glucose reference range applies only to samples taken after fasting for at least 8 hours.   BUN 13 6 - 20 mg/dL   Creatinine, Ser 1.611.01 0.61 - 1.24 mg/dL   Calcium 8.9 8.9 - 09.610.3 mg/dL   Total Protein 7.0 6.5 - 8.1 g/dL   Albumin 4.2 3.5 - 5.0 g/dL   AST 23 15 - 41 U/L   ALT 32 0 - 44 U/L   Alkaline Phosphatase 50 38 - 126 U/L   Total Bilirubin 1.4 (H) 0.3 - 1.2 mg/dL   GFR, Estimated >04>60 >54>60 mL/min    Comment: (NOTE) Calculated using the CKD-EPI Creatinine Equation (2021)    Anion gap 8 5 - 15    Comment: Performed at [facility], [address] [place], [place] [address]  Hemoglobin A1c     Status: None   Collection Time: 11/03/20  6:28 AM  Result Value Ref Range   Hgb A1c MFr Bld 5.3 4.8 - 5.6 %    Comment: (NOTE) Pre diabetes:          5.7%-6.4%  Diabetes:              >6.4%  Glycemic control for   <7.0% adults with diabetes    Mean Plasma Glucose 105.41 mg/dL    Comment: Performed at [facility], [address] [place], [place] [address]  Lipid panel     Status: Abnormal   Collection Time: 11/03/20  6:28 AM  Result Value Ref Range   Cholesterol 221 (H) 0 - 200 mg/dL   Triglycerides 295356 (H) <150 mg/dL   HDL 31 (L) >62>40 mg/dL   Total CHOL/HDL Ratio 7.1 RATIO   VLDL 71 (H) 0 - 40 mg/dL   LDL Cholesterol 130119 (H) 0 - 99 mg/dL    Comment:        Total Cholesterol/HDL:CHD Risk Coronary Heart Disease Risk Table                     Men   Women  1/2 Average Risk   3.4   3.3  Average Risk       5.0   4.4  2 X Average Risk   9.6   7.1  3 X Average Risk  23.4   11.0        Use the calculated Patient Ratio above and the CHD Risk Table to determine  the patient's CHD Risk.        ATP III CLASSIFICATION (LDL):  <100     mg/dL   Optimal  [id number]  mg/dL   Near or Above                    Optimal  130-159  mg/dL   Borderline  [id number]  mg/dL   High  >284>190     mg/dL   Very High Performed at [facility], [address] [place], [place] [address]   TSH     Status: None   Collection Time: 11/03/20  6:28 AM  Result Value Ref Range   TSH 1.020 0.350 - 4.500 uIU/mL    Comment: Performed by a 3rd Generation assay with a  functional sensitivity of <=0.01 uIU/mL. Performed at [facility], [address], [place], [place] [address]   Ferritin     Status: None   Collection Time: 11/03/20  6:28 AM  Result Value Ref Range   Ferritin 77 24 - 336 ng/mL    Comment: Performed at [facility], [address], [place], [place] [address]  VITAMIN D 25 Hydroxy (Vit-D Deficiency, Fractures)     Status: Abnormal   Collection Time: 11/03/20  6:28 AM  Result Value Ref Range   Vit D, 25-Hydroxy 26.88 (L) 30 - 100 ng/mL    Comment: (NOTE) Vitamin D deficiency has been defined by the Institute of Medicine  and an Endocrine Society practice guideline as a level of serum 25-OH  vitamin D less than 20 ng/mL (1,2). The Endocrine Society went on to  further define vitamin D insufficiency as a level between 21 and 29  ng/mL (2).  1. IOM (Institute of Medicine). 2010. Dietary reference intakes for  calcium and D. [place]: [employer]. 2. Holick MF, Binkley Sims, Bischoff-Ferrari HA, et al. Evaluation,  treatment, and prevention of vitamin D deficiency: an Endocrine  Society clinical practice guideline, JCEM. 2011 Jul; 96(7): 1911-30.  Performed at [facility], [address], [place], [place] [address]     Blood Alcohol level:  Lab Results  Component Value Date   ETH <10 03/21/2018    Metabolic Disorder Labs: Lab Results  Component Value Date   HGBA1C 5.3  11/03/2020   MPG 105.41 11/03/2020   MPG 96.8 03/22/2018   No results found for: PROLACTIN Lab Results  Component Value Date   CHOL 221 (H) 11/03/2020   TRIG 356 (H) 11/03/2020   HDL 31 (L) 11/03/2020   CHOLHDL 7.1 11/03/2020   VLDL 71 (H) 11/03/2020   LDLCALC 119 (H) 11/03/2020   LDLCALC 186 (H) 03/22/2018    Physical Findings: AIMS: 0, no cogwheeling or rigidity, no tremor or bradykinesia   Musculoskeletal: Strength & Muscle Tone: within normal limits Gait & Station:  NA Patient leans: NA  Psychiatric Specialty Exam:  Presentation  General Appearance:  Appropriate for Environment Eye Contact: Good Speech: Clear and Coherent Speech Volume: Normal Handedness: No data recorded  Mood and Affect  Mood: Mildly dysphoric Affect: Constricted  Thought Process  Thought Processes: Coherent; Linear; Goal Directed Descriptions of Associations:Intact Orientation:Full (Time, Place and Person) Thought Content:Logical - denies ideas of reference, first rank symptoms, paranoia, and is not grossly responding to internal/external stimuli on exam History of Schizophrenia/Schizoaffective disorder:No  Duration of Psychotic Symptoms:Greater than six months  Hallucinations:Hallucinations: Auditory Description of Auditory Hallucinations: chatter at night Ideas of Reference:None Suicidal Thoughts:Suicidal Thoughts: No Homicidal Thoughts:Homicidal Thoughts: No  Sensorium  Memory: Immediate Fair; Recent Fair; Remote Fair Judgment: Fair Insight: Fair  Art therapist  Concentration: Good Attention Span: Good Recall: Good Fund of Knowledge: Good Language: Good  Psychomotor Activity  Psychomotor Activity: Psychomotor Activity: Normal  Assets  Assets: Physical Health; Social Support; Talents/Skills; Transportation; Vocational/Educational  Sleep  Sleep: Sleep: Fair Number of Hours of Sleep: 7.25   Physical Exam: Physical Exam Vitals and nursing  note reviewed.  HENT:     Head: Normocephalic and atraumatic.  Pulmonary:     Effort: Pulmonary effort is normal.  Neurological:     General: No focal deficit present.     Mental Status: He is alert and oriented to person, place, and time.   Review of Systems  Respiratory:  Negative for shortness of breath.   Cardiovascular:  Negative for chest pain.  Gastrointestinal:  Negative for diarrhea, nausea and vomiting.  Neurological:  Negative for headaches.  Blood pressure 114/80, pulse (!) 101, temperature (!) 97.3 F (36.3 C), temperature source Oral, resp. rate 18, SpO2 98 %. There is no height or weight on file to calculate BMI.   Treatment Plan Summary: Daily contact with patient to assess and evaluate symptoms and progress in treatment and Medication management  Safety and Monitoring -- VOLUNTARY admission to inpatient psychiatric unit for safety, stabilization and treatment -- Daily contact with patient to assess and evaluate symptoms and progress in treatment -- Patient's case to be discussed in multi-disciplinary team meeting -- Observation Level : q15 minute checks -- Vital signs:  q12 hours -- Precautions: suicide  Depression and SI in the context of psychotic symptoms  Patient reports longstanding depression in the context of years of low intensity AVH that are not mood congruent. Concern for developing primary psychotic disorder. Patient has denied SI since 8/29.  -Continue Risperdal 6 mg QHS (Per patient, home dose is 4 mg).  Antipsychotic labs  EKG: NSR, Qtc 437 (8/30)  Lipids: chol 221, TG 356, HDL 31, LDL 119 (rechecking fasting lipids tomorrow)  A1C: 5.3 AIMS: 0, no cogwheeling or rigidity, no tremor or bradykinesia (8/29) - will order Cogentin 1mg  bid PRN EPS/tremor -Continue Lexapro 30 mg (patient was taking 20 mg PTA).   Medical Management Covid negative CMP: unremarkable CBC: WNL EtOH: <10 UDS: negative TSH: 1.0 A1C: 5.3 Lipids: as above (will require  outpatient management)  Low Vitamin D - start Vit D 50,000 units weekly  - will need PCP f/u after discharge  Hyperlipidemia/Hypertriglyceridemia -- Rechecking fasting lipid panel tomorrow for accuracy  Continue Zonisamide 250 mg daily for migraines, and Zomig PRN. Protonix 40 mg BID for GERD Gabapentin 100/100/400 for gagging/vomiting  Continue PRN's: Ibuprofen, Maalox, Atarax, Milk of Magnesia, Trazodone  Dispo: home/self care, talk with LCSW about moving sleep study date  PGY-1, Psychiatry

## 2020-11-04 NOTE — [facility] Group Notes (Signed)
Adult Psychoeducational Group Note  Date:  11/04/2020 Time:  7:15 PM  Group Topic/Focus:  Healthy Communication:   The focus of this group is to discuss communication, barriers to communication, as well as healthy ways to communicate with others.  Participation Level:  Active  Participation Quality:  Appropriate and Attentive  Affect:  Anxious  Cognitive:  Alert and Appropriate  Insight: Appropriate and Good  Engagement in Group:  Engaged  Modes of Intervention:  Discussion  Additional Comments:  Pt attended and participated in the afternoon healthy communication group.   Deforest Hoyles Kevina Piloto 11/04/2020, 7:15 PM

## 2020-11-04 NOTE — Progress Notes (Signed)
The focus of this group is to help patients review their daily goal of treatment and discuss progress on daily workbooks.   Pt attended the evening group session and responded to all discussion prompts from the Writer. Pt shared that today was a good day on the unit, the highlight of which was a visit from his father.  In wrap-up, Pts discussed ways to overcome anxiety. Jeffery Johnson mentioned such coping skills as walking, talking to friends, and reading a book.  Pt rated his day a 6 out of 10 and his affect was appropriate.

## 2020-11-05 DIAGNOSIS — F333 Major depressive disorder, recurrent, severe with psychotic symptoms: Secondary | ICD-10-CM | POA: Diagnosis not present

## 2020-11-05 LAB — LIPID PANEL
Cholesterol: 243 mg/dL — ABNORMAL HIGH (ref 0–200)
HDL: 37 mg/dL — ABNORMAL LOW (ref >40)
LDL Cholesterol: 154 mg/dL — ABNORMAL HIGH (ref 0–99)
Total CHOL/HDL Ratio: 6.6 RATIO
Triglycerides: 259 mg/dL — ABNORMAL HIGH (ref <150)
VLDL: 52 mg/dL — ABNORMAL HIGH (ref 0–40)

## 2020-11-05 MED ORDER — LOPERAMIDE HCL 2 MG PO CAPS
2.0000 mg | ORAL_CAPSULE | Freq: Once | ORAL | Status: AC
  Administered 2020-11-05: 2 mg via ORAL
  Filled 2020-11-05 (×2): qty 1

## 2020-11-05 MED ORDER — BENZTROPINE MESYLATE 1 MG PO TABS
1.0000 mg | ORAL_TABLET | Freq: Two times a day (BID) | ORAL | Status: DC
  Administered 2020-11-05 – 2020-11-07 (×5): 1 mg via ORAL
  Filled 2020-11-05 (×7): qty 1

## 2020-11-05 NOTE — Progress Notes (Signed)
Pt is alert and oriented to person, place, time and situation. Denies SI/HI/AVH. Pt is calm, cooperative, pleasant, attends groups. Will continue to monitor pt per Q15 minute face checks and monitor for safety and progress.

## 2020-11-05 NOTE — [facility] Group Notes (Signed)
[facility] Group Notes:  (Nursing/MHT/Case Management/Adjunct)  Date:  11/05/2020  Time:  9:11 AM  Type of Therapy:  Group Therapy  Participation Level:  Active  Participation Quality:  Appropriate  Affect:  Appropriate  Cognitive:  Appropriate  Insight:  Appropriate  Engagement in Group:  Engaged  Modes of Intervention:  Discussion  Summary of Progress/Problems:  Patient attended goals group and stayed appropriate throughout group. 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 A goal the patient wants to accomplish within a year is to open an investing account so that he can retire early.   Jeffery Johnson 11/05/2020, 9:11 AM

## 2020-11-05 NOTE — Progress Notes (Signed)
Recreation Therapy Notes  Date: 8.31.22 Time: 0930 Location: [address] Dayroom  Group Topic: Stress Management   Goal Area(s) Addresses:  Patient will actively participate in stress management techniques presented during session.  Patient will successfully identify benefit of practicing stress management post d/c.   Intervention: Relaxation exercise with ambient sound and script   Activity: Guided Imagery. LRT provided education, instruction, and demonstration on practice of visualization via guided imagery. Patient was asked to participate in the technique introduced during session. LRT debriefed including topics of mindfulness, stress management and specific scenarios each patient could use these techniques. Patients were given suggestions of ways to access scripts post d/c and encouraged to explore Youtube and other apps available on smartphones, tablets, and computers.  Education:  Stress Management, Discharge Planning.   Education Outcome: Acknowledges education  Clinical Observations/Feedback: Due to there being a COVID case on the unit group did not occur as scheduled.  LRT did give out packets dealing with symptoms and triggers of stress.  It also dealt with identifying things that are in your control and things that are not.     Kingdom Vanzanten, LRT/CTRS         Rozetta Stumpp A 11/05/2020 11:30 AM 

## 2020-11-05 NOTE — Progress Notes (Signed)
Pt requested and is given PRN Imodium for c/o diarrhea. Pt denies any other symptoms at this time and reports he thinks it's his IBS that he has a history of this. Reported this compliant to pt's MD. Will continue to monitor.

## 2020-11-05 NOTE — [facility] Group Notes (Signed)
[facility] Group Notes:  (Nursing/MHT/Case Management/Adjunct)  Date:  11/05/2020  Time:  4:50 PM  Type of Therapy:  Group Therapy  Participation Level:  Active  Participation Quality:  Appropriate  Affect:  Appropriate  Cognitive:  Appropriate  Insight:  Appropriate  Engagement in Group:  Engaged  Modes of Intervention:  Education  Summary of Progress/Problems:Pt was engaged in group.  Jaquita Rector 11/05/2020, 4:50 PM

## 2020-11-05 NOTE — [facility] Group Notes (Signed)
Topic:    Due to the acuity and Covid-19 precautions, group was not held. Patient was provided therapeutic worksheets and asked to meet with CSW as needed.  Deshia Vanderhoof, LCSWA Clinicial Social Worker [facility]  

## 2020-11-05 NOTE — [facility] Group Notes (Signed)
[facility] Group Notes:  (Nursing/MHT/Case Management/Adjunct)  Date:  11/05/2020  Time:  2:48 PM  Type of Therapy:  Psychoeducational Skills  Participation Level:  Active  Participation Quality:  Appropriate  Affect:  Appropriate  Cognitive:  Appropriate  Insight:  Appropriate  Engagement in Group:  Engaged  Modes of Intervention:  Discussion  Summary of Progress/Problems:  Patient attended group and stayed appropriate. Patient was asked where they see themself in 10 years and what are some good qualities they have. Patient stated that in 10 years, he hopes to have finished his education and use it in his career. Some good qualities he has are being observant and listening to others well.   Daneil Dan 11/05/2020, 2:48 PM

## 2020-11-05 NOTE — Progress Notes (Addendum)
[facility] MD Progress Note  11/05/2020 5:39 PM Jeffery Johnson  MRN:  [id number] Subjective:   Jeffery Johnson is a 25 year old male with a PPHx of MDD with psychotic features and GAD presenting voluntarily with SI and AH.   The patient's chart was reviewed and nursing notes were reviewed. Over the past 24 hrs, there were no documented behavioral issues, no PRN medications given for agitation.The patient's case was discussed in multidisciplinary team meeting.   On interview, the patient reports recent diarrhea that is not severe in nature which he feels is related to his IBS.  On earlier examination with the attending physician, cogwheeling was noted and the patient was therefore started on Cogentin.  The patient notes previous episodes of muscle cramping in the arm on his home dose of Risperdal 4 mg.  The patient reports experiencing no auditory hallucinations last night.  He states this is the first time in a long time but he has not heard voices at night. He reports good sleep last night with the trazodone.  He is amenable to an EKG tomorrow and asks appropriate questions regarding the utility of the test.  He denies SI and visual hallucinations, review of systems is as below.  Principal Problem: Severe episode of recurrent major depressive disorder, with psychotic features (HCC) Diagnosis: Principal Problem:   Severe episode of recurrent major depressive disorder, with psychotic features (HCC) Active Problems:   Excessive daytime sleepiness   Insomnia due to mental condition   Hypersomnia with sleep apnea   Migraine   Anxiety  Total Time Spent in Direct Patient Care: I personally spent 30 minutes on the unit in direct patient care. The direct patient care time included face-to-face time with the patient, reviewing the patient's chart, communicating with other professionals, and coordinating care. Greater than 50% of this time was spent in counseling or coordinating care with the patient regarding goals of  hospitalization, psycho-education, and discharge planning needs.   Past Psychiatric History: Previous admission in 2020 for AVH and SI with plan to slit wrists with a knife (never actually done). Admission H and P reports self-mutilation previously, unclear if this occurred.   Past Medical History:  Past Medical History:  Diagnosis Date   ADHD (attention deficit hyperactivity disorder)    Anxiety    Depression    Excessive daytime sleepiness 07/05/2014   Insomnia due to mental condition 07/05/2014   Migraine    Seasonal allergies    History reviewed. No pertinent surgical history. Family History:  Family History  Problem Relation Age of Onset   Breast cancer Mother    Skin cancer Mother    Stroke Maternal Grandfather    Heart disease Maternal Grandfather    Diabetes Maternal Grandfather    Stroke Paternal Grandfather    Heart disease Paternal Grandfather    Seizures Maternal Grandmother    Seizures Cousin        mothers side    Family Psychiatric  History: Patient's father apparently has been diagnosed with a bipolar type condition, he had an uncle who committed suicide and the mother has been treated for depression.    Sleep: Fair  Appetite:  Fair  Current Medications: Current Facility-Administered Medications  Medication Dose Route Frequency Provider Last Rate Last Admin   alum & mag hydroxide-simeth (MAALOX/MYLANTA) 200-200-20 MG/5ML suspension 15 mL  15 mL Oral Q6H PRN Carlyn Reichert, MD       benztropine (COGENTIN) tablet 1 mg  1 mg Oral BID PRN Mason Jim, Rondal Vandevelde  E, MD       benztropine (COGENTIN) tablet 1 mg  1 mg Oral BID Mason Jim, Heidi Maclin E, MD   1 mg at 11/05/20 1638   escitalopram (LEXAPRO) tablet 30 mg  30 mg Oral QHS Mariel Craft, MD   30 mg at 11/04/20 2112   gabapentin (NEURONTIN) capsule 100 mg  100 mg Oral BID WC Mariel Craft, MD   100 mg at 11/05/20 1153   gabapentin (NEURONTIN) capsule 400 mg  400 mg Oral QHS Mariel Craft, MD   400 mg at 11/04/20  2108   hydrOXYzine (ATARAX/VISTARIL) tablet 25 mg  25 mg Oral Q6H PRN Carlyn Reichert, MD       ibuprofen (ADVIL) tablet 400 mg  400 mg Oral BID PRN Carlyn Reichert, MD   400 mg at 11/04/20 1509   magnesium hydroxide (MILK OF MAGNESIA) suspension 5 mL  5 mL Oral Daily PRN Carlyn Reichert, MD       pantoprazole (PROTONIX) EC tablet 40 mg  40 mg Oral Bobby Rumpf, MD   40 mg at 11/05/20 0749   risperiDONE (RISPERDAL) tablet 6 mg  6 mg Oral QHS Mariel Craft, MD   6 mg at 11/04/20 2108   traZODone (DESYREL) tablet 50 mg  50 mg Oral QHS PRN Carlyn Reichert, MD   50 mg at 11/04/20 2109   Vitamin D (Ergocalciferol) (DRISDOL) capsule 50,000 Units  50,000 Units Oral Q7 days Comer Locket, MD   50,000 Units at 11/05/20 5732   zolmitriptan (ZOMIG) nasal solution 1 spray  1 spray Nasal PRN Mariel Craft, MD       zonisamide [facility]) capsule 250 mg  250 mg Oral QHS Mariel Craft, MD   250 mg at 11/04/20 2108    Lab Results:  Results for orders placed or performed during the hospital encounter of 11/02/20 (from the past 48 hour(s))  Lipid panel     Status: Abnormal   Collection Time: 11/05/20  6:42 AM  Result Value Ref Range   Cholesterol 243 (H) 0 - 200 mg/dL   Triglycerides 202 (H) <150 mg/dL   HDL 37 (L) >54 mg/dL   Total CHOL/HDL Ratio 6.6 RATIO   VLDL 52 (H) 0 - 40 mg/dL   LDL Cholesterol 270 (H) 0 - 99 mg/dL    Comment:        Total Cholesterol/HDL:CHD Risk Coronary Heart Disease Risk Table                     Men   Women  1/2 Average Risk   3.4   3.3  Average Risk       5.0   4.4  2 X Average Risk   9.6   7.1  3 X Average Risk  23.4   11.0        Use the calculated Patient Ratio above and the CHD Risk Table to determine the patient's CHD Risk.        ATP III CLASSIFICATION (LDL):  <100     mg/dL   Optimal  623-762  mg/dL   Near or Above                    Optimal  130-159  mg/dL   Borderline  831-517  mg/dL   High  >616     mg/dL   Very High Performed  at [facility], [address], [place], [place] [address]  Blood Alcohol level:  Lab Results  Component Value Date   ETH <10 03/21/2018    Metabolic Disorder Labs: Lab Results  Component Value Date   HGBA1C 5.3 11/03/2020   MPG 105.41 11/03/2020   MPG 96.8 03/22/2018   No results found for: PROLACTIN Lab Results  Component Value Date   CHOL 243 (H) 11/05/2020   TRIG 259 (H) 11/05/2020   HDL 37 (L) 11/05/2020   CHOLHDL 6.6 11/05/2020   VLDL 52 (H) 11/05/2020   LDLCALC 154 (H) 11/05/2020   LDLCALC 119 (H) 11/03/2020    Physical Findings: AIMS: 0, no cogwheeling or rigidity, no tremor or bradykinesia (8/29) Cogwheeling noted by attending physician (8/31)   Musculoskeletal: Strength & Muscle Tone: within normal limits Gait & Station:  NA Patient leans: NA  Psychiatric Specialty Exam:  Presentation  General Appearance:  Appropriate for Environment Eye Contact: Good Speech: Clear and Coherent Speech Volume: Normal Handedness: No data recorded  Mood and Affect  Mood: Mildly dysphoric Affect: Constricted  Thought Process  Thought Processes: Coherent; Linear; Goal Directed Descriptions of Associations:Intact Orientation:Full (Time, Place and Person) Thought Content:Logical - denies ideas of reference, first rank symptoms, paranoia, and is not grossly responding to internal/external stimuli on exam History of Schizophrenia/Schizoaffective disorder:No  Duration of Psychotic Symptoms:Greater than six months  Hallucinations: denies AH over the past 24 hrs Ideas of Reference:None Suicidal Thoughts: denies Homicidal Thoughts: none reported  Sensorium  Memory: Immediate Fair; Recent Fair; Remote Fair Judgment: Fair Insight: Fair  Art therapist  Concentration: Good Attention Span: Good Recall: Good Fund of Knowledge: Good Language: Good  Psychomotor Activity  Psychomotor Activity: Cogwheeling as above, otherwise  nml  Assets  Assets: Physical Health; Social Support; Talents/Skills; Transportation; Vocational/Educational  Sleep  Sleep: 6.5 hrs   Physical Exam: Physical Exam Vitals and nursing note reviewed.  HENT:     Head: Normocephalic and atraumatic.  Pulmonary:     Effort: Pulmonary effort is normal.  Neurological:     General: No focal deficit present.     Mental Status: He is alert and oriented to person, place, and time.   Review of Systems  Respiratory:  Negative for shortness of breath.   Cardiovascular:  Negative for chest pain.  Gastrointestinal:  Positive for diarrhea. Negative for nausea and vomiting.  Neurological:  Negative for headaches.  Blood pressure 117/64, pulse (!) 107, temperature 97.8 F (36.6 C), temperature source Oral, resp. rate 18, SpO2 98 %. There is no height or weight on file to calculate BMI.   Treatment Plan Summary: Daily contact with patient to assess and evaluate symptoms and progress in treatment and Medication management  Safety and Monitoring -- VOLUNTARY admission to inpatient psychiatric unit for safety, stabilization and treatment -- Daily contact with patient to assess and evaluate symptoms and progress in treatment -- Patient's case to be discussed in multi-disciplinary team meeting -- Observation Level : q15 minute checks -- Vital signs:  q12 hours -- Precautions: suicide  Depression and SI in the context of psychotic symptoms  Patient reports longstanding depression in the context of years of low intensity AVH that are not mood congruent. Concern for developing primary psychotic disorder. Patient has denied SI since 8/29. Patient reports cessation of AH as of 8/30 evening.  -Continue Risperdal 6 mg QHS (Per patient, home dose is 4 mg).  Antipsychotic labs  EKG: NSR, Qtc 437 (8/30), recheck EKG on 9/1  Lipids: chol 221, TG 356, HDL 31, LDL 119 (recheck 8/31 with similar results)  A1C: 5.3 AIMS: 0, no cogwheeling or rigidity, no  tremor or bradykinesia (8/29)  Cogwheeling noted on attending's exam (8/31) - Start Cogentin 1 mg BID with further 1 mg BID PRN -Continue Lexapro 30 mg (patient was taking 20 mg PTA).   Medical Management Covid negative CMP: unremarkable CBC: WNL EtOH: <10 UDS: negative TSH: 1.0 A1C: 5.3 Lipids: as above (will require outpatient management)  Low Vitamin D - start Vit D 50,000 units weekly  - will need PCP f/u after discharge  Diarrhea -Patient given imodium 2 mg one time  Continue Zonisamide 250 mg daily for migraines, and Zomig PRN. Protonix 40 mg BID for GERD Gabapentin 100/100/400 for gagging/vomiting  Continue PRN's: Ibuprofen, Maalox, Atarax, Milk of Magnesia, Trazodone  Dispo: home/self care, likely Friday AM. Patient has sleep study scheduled for Sept 2 evening.   Carlyn ReichertNick Gabrielle PGY-1, Psychiatry

## 2020-11-06 ENCOUNTER — Encounter (HOSPITAL_COMMUNITY): Payer: Self-pay | Admitting: Psychiatry

## 2020-11-06 DIAGNOSIS — F333 Major depressive disorder, recurrent, severe with psychotic symptoms: Secondary | ICD-10-CM | POA: Diagnosis not present

## 2020-11-06 NOTE — Progress Notes (Signed)
PROGRESS NOTE    Jeffery Johnson  KXF:[id number] DOB: 03-16-1995 DOA: 11/02/2020  Pt is A/Ox4, participating with group.  Stated that he has thoughts/feelings of depression with 'some' anxiety however "being around everyone kinda helps."  Pt denies SI/HI, reports no visual hallucinations with minor Auditory hallucinations described as "Like people mumbling from across the house."  Pt has good eye contact noted, with normalized speech patterns.  Cont to monitor pt as ordered for safety and for any changes in behaviors.  Objective: Vitals:   11/05/20 0631 11/05/20 0632 11/06/20 0639 11/06/20 0640  BP: 115/81 117/64 136/75 (!) 149/84  Pulse: 97 (!) 107 87 88  Resp: 18     Temp: 97.8 F (36.6 C)   97.6 F (36.4 C)  TempSrc: Oral   Oral  SpO2:   98% 97%     General exam: Appears calm and comfortable  Respiratory system: Clear to auscultation. Respiratory effort normal. Cardiovascular system: S1 & S2 heard, RRR. No JVD, murmurs, rubs, gallops or clicks. No pedal edema. Gastrointestinal system: Abdomen is nondistended, soft and nontender. No organomegaly or masses felt. Normal bowel sounds heard. Central nervous system: Alert and oriented. No focal neurological deficits. Extremities: Symmetric 5 x 5 power. Skin: No rashes, lesions or ulcers Psychiatry: Judgement and insight appear normal. Mood & affect appropriate.      CBC: Recent Labs  Lab 11/03/20 0628  WBC 7.8  NEUTROABS 3.3  HGB 15.7  HCT 44.8  MCV 87.0  PLT 281   Basic Metabolic Panel: Recent Labs  Lab 11/03/20 0628  NA 136  K 3.7  CL 103  CO2 25  GLUCOSE 88  BUN 13  CREATININE 1.01  CALCIUM 8.9   GFR: CrCl cannot be calculated (Unknown ideal weight.). Liver Function Tests: Recent Labs  Lab 11/03/20 0628  AST 23  ALT 32  ALKPHOS 50  BILITOT 1.4*  PROT 7.0  ALBUMIN 4.2   No results for input(s): LIPASE, AMYLASE in the last 168 hours. No results for input(s): AMMONIA in the last 168 hours. Coagulation  Profile: No results for input(s): INR, PROTIME in the last 168 hours. Cardiac Enzymes: No results for input(s): CKTOTAL, CKMB, CKMBINDEX, TROPONINI in the last 168 hours. BNP (last 3 results) No results for input(s): PROBNP in the last 8760 hours. HbA1C: No results for input(s): HGBA1C in the last 72 hours. CBG: No results for input(s): GLUCAP in the last 168 hours. Lipid Profile: Recent Labs    11/05/20 0642  CHOL 243*  HDL 37*  LDLCALC 154*  TRIG 259*  CHOLHDL 6.6   Thyroid Function Tests: No results for input(s): TSH, T4TOTAL, FREET4, T3FREE, THYROIDAB in the last 72 hours. Anemia Panel: No results for input(s): VITAMINB12, FOLATE, FERRITIN, TIBC, IRON, RETICCTPCT in the last 72 hours. Urine analysis:    Component Value Date/Time   COLORURINE YELLOW 03/21/2018 1521   APPEARANCEUR CLEAR 03/21/2018 1521   LABSPEC 1.008 03/21/2018 1521   PHURINE 7.0 03/21/2018 1521   GLUCOSEU NEGATIVE 03/21/2018 1521   HGBUR NEGATIVE 03/21/2018 1521   BILIRUBINUR NEGATIVE 03/21/2018 1521   KETONESUR NEGATIVE 03/21/2018 1521   PROTEINUR NEGATIVE 03/21/2018 1521   UROBILINOGEN 0.2 01/11/2014 1855   NITRITE NEGATIVE 03/21/2018 1521   LEUKOCYTESUR NEGATIVE 03/21/2018 1521   Sepsis Labs: @LABRCNTIP (procalcitonin:4,lacticidven:4)  ) Recent Results (from the past 240 hour(s))  Resp Panel by RT-PCR (Flu A&B, Covid) Nasopharyngeal Swab     Status: None   Collection Time: 11/02/20  4:32 PM   Specimen: Nasopharyngeal Swab; Nasopharyngeal(NP)  swabs in vial transport medium  Result Value Ref Range Status   SARS Coronavirus 2 by RT PCR NEGATIVE NEGATIVE Final    Comment: (NOTE) SARS-CoV-2 target nucleic acids are NOT DETECTED.  The SARS-CoV-2 RNA is generally detectable in upper respiratory specimens during the acute phase of infection. The lowest concentration of SARS-CoV-2 viral copies this assay can detect is 138 copies/mL. A negative result does not preclude SARS-Cov-2 infection and  should not be used as the sole basis for treatment or other patient management decisions. A negative result may occur with  improper specimen collection/handling, submission of specimen other than nasopharyngeal swab, presence of viral mutation(s) within the areas targeted by this assay, and inadequate number of viral copies(<138 copies/mL). A negative result must be combined with clinical observations, patient history, and epidemiological information. The expected result is Negative.  Fact Sheet for Patients:  BloggerCourse.com  Fact Sheet for Healthcare Providers:  SeriousBroker.it  This test is no t yet approved or cleared by the [place] FDA and  has been authorized for detection and/or diagnosis of SARS-CoV-2 by FDA under an Emergency Use Authorization (EUA). This EUA will remain  in effect (meaning this test can be used) for the duration of the COVID-19 declaration under Section 564(b)(1) of the Act, 21 U.S.C.section 360bbb-3(b)(1), unless the authorization is terminated  or revoked sooner.       Influenza A by PCR NEGATIVE NEGATIVE Final   Influenza B by PCR NEGATIVE NEGATIVE Final    Comment: (NOTE) The Xpert Xpress SARS-CoV-2/FLU/RSV plus assay is intended as an aid in the diagnosis of influenza from Nasopharyngeal swab specimens and should not be used as a sole basis for treatment. Nasal washings and aspirates are unacceptable for Xpert Xpress SARS-CoV-2/FLU/RSV testing.  Fact Sheet for Patients: BloggerCourse.com  Fact Sheet for Healthcare Providers: SeriousBroker.it  This test is not yet approved or cleared by the [place] FDA and has been authorized for detection and/or diagnosis of SARS-CoV-2 by FDA under an Emergency Use Authorization (EUA). This EUA will remain in effect (meaning this test can be used) for the duration of the COVID-19 declaration  under Section 564(b)(1) of the Act, 21 U.S.C. section 360bbb-3(b)(1), unless the authorization is terminated or revoked.  Performed at [facility], [address], [place], [place] [address]      Scheduled Meds:  benztropine  1 mg Oral BID   escitalopram  30 mg Oral QHS   gabapentin  100 mg Oral BID WC   gabapentin  400 mg Oral QHS   pantoprazole  40 mg Oral BH-qamhs   risperiDONE  6 mg Oral QHS   Vitamin D (Ergocalciferol)  50,000 Units Oral Q7 days   zonisamide  250 mg Oral QHS

## 2020-11-06 NOTE — [facility] Group Notes (Signed)
Adult Psychoeducational Group Note  Date:  11/06/2020 Time:  4:51 PM  Group Topic/Focus:  Managing Feelings:   The focus of this group is to identify what feelings patients have difficulty handling and develop a plan to handle them in a healthier way upon discharge.  Participation Level:  Active  Participation Quality:  Attentive  Affect:  Appropriate  Cognitive:  Alert  Insight: Appropriate  Engagement in Group:  Engaged  Modes of Intervention:  Discussion  Additional Comments:  Patient attended and participated in the Psycho-Ed group.  Jearl Klinefelter 11/06/2020, 4:51 PM

## 2020-11-06 NOTE — Plan of Care (Signed)
  Problem: Education: Goal: Mental status will improve Outcome: Progressing Goal: Verbalization of understanding the information provided will improve Outcome: Progressing   Problem: Activity: Goal: Interest or engagement in activities will improve Outcome: Progressing   

## 2020-11-06 NOTE — Plan of Care (Signed)
Nurse discussed coping skills with patient.  

## 2020-11-06 NOTE — [facility] Group Notes (Signed)
[facility] Group Notes:  (Nursing/MHT/Case Management/Adjunct)  Date:  11/06/2020  Time:  8:37 PM  Type of Therapy:  Group Therapy  Participation Level:  Active  Participation Quality:  Attentive  Affect:  Appropriate  Cognitive:  Appropriate  Insight:  Appropriate  Engagement in Group:  Developing/Improving  Modes of Intervention:  Discussion  Summary of Progress/Problems:  Lorita Officer 11/06/2020, 8:37 PM

## 2020-11-06 NOTE — Progress Notes (Signed)
     11/06/20 2123  Psych Admission Type (Psych Patients Only)  Admission Status Voluntary  Psychosocial Assessment  Patient Complaints Anxiety  Eye Contact Fair  Facial Expression Anxious  Affect Depressed  Speech Logical/coherent  Interaction Other (Comment) (wnl)  Motor Activity Other (Comment) (steady)  Appearance/Hygiene Unremarkable  Behavior Characteristics Cooperative;Calm;Appropriate to situation  Mood Pleasant  Thought Process  Coherency WDL  Content WDL  Delusions WDL  Perception WDL  Hallucination None reported or observed  Judgment WDL  Confusion None  Danger to Self  Current suicidal ideation? Denies  Self-Injurious Behavior No self-injurious ideation or behavior indicators observed or expressed   Agreement Not to Harm Self Yes  Description of Agreement verbal contract for safety  Danger to Others  Danger to Others None reported or observed

## 2020-11-06 NOTE — Progress Notes (Signed)
D:  Patient denied SI and HI.  Stated he does hear voices or background noise, as if the noise is from the other side of the house.         Denied visual hallucinations. D:  Medications administered per MD orders.  Emotional support and encouragement given patient. R:  Safety maintained with 15 minute checks.

## 2020-11-06 NOTE — Progress Notes (Addendum)
Jeffery Johnson  11/06/2020 3:28 PM Jeffery Johnson  MRN:  [id number] Subjective:   Jeffery Johnson is a 25 year old male with a psychiatric history of MDD w/ psychotic fx and GAD presenting voluntarily for SI and auditory hallucinations.  On assessment, he states that he is "[place], seeing the light at the end of the tunnel." He slept well last night and also says that he feels much more "loose" since taking the Cogentin. AH are still endorsed, but are much quieter now and feel closer to his baseline, although the voices have, at some point, completely stopped while on medications. As well, he has felt chronically paranoid, as though people are talking about him, but he denies thought control and receiving messages from electronics. He is excited to go home tomorrow, pending EKG. No SI/HI/VH, and further ROS below.   Principal Problem: Severe episode of recurrent major depressive disorder, with psychotic features (HCC) Diagnosis: Principal Problem:   Severe episode of recurrent major depressive disorder, with psychotic features (HCC) Active Problems:   Excessive daytime sleepiness   Insomnia due to mental condition   Hypersomnia with sleep apnea   Migraine   Anxiety  Total Time spent with patient:  I personally spent 20 minutes on the unit in direct patient care. The direct patient care time included face-to-face time with the patient, reviewing the patient's chart, communicating with other professionals, and coordinating care. Greater than 50% of this time was spent in counseling or coordinating care with the patient regarding goals of hospitalization, psycho-education, and discharge planning needs.    Past Psychiatric History: [facility] inpatient (2020): SI with plan to cut wrists but no intent and AVH. "Admission H and P reports self-mutilation previously, unclear if this occurred. "  Past Medical History:  Past Medical History:  Diagnosis Date   ADHD (attention deficit hyperactivity disorder)     Anxiety    Depression    Excessive daytime sleepiness 07/05/2014   Insomnia due to mental condition 07/05/2014   Migraine    Seasonal allergies    History reviewed. No pertinent surgical history. Family History:  Family History  Problem Relation Age of Onset   Breast cancer Mother    Skin cancer Mother    Stroke Maternal Grandfather    Heart disease Maternal Grandfather    Diabetes Maternal Grandfather    Stroke Paternal Grandfather    Heart disease Paternal Grandfather    Seizures Maternal Grandmother    Seizures Cousin        mothers side    Family Psychiatric  History: Father: bipolar spectrum Mother: Depression Uncle: completed suicide  Social History:  Social History   Substance and Sexual Activity  Alcohol Use Not Currently     Social History   Substance and Sexual Activity  Drug Use Not Currently   Comment: uses every 2-3 months, stopped 01/2018    Social History   Socioeconomic History   Marital status: Single    Spouse name: Not on file   Number of children: Not on file   Years of education: Not on file   Highest education level: Some college, no degree  Occupational History   Not on file  Tobacco Use   Smoking status: Former    Types: Cigarettes   Smokeless tobacco: Never   Tobacco comments:    socially  Advertising account planner   Vaping Use: Unknown  Substance and Sexual Activity   Alcohol use: Not Currently   Drug use: Not Currently    Comment:  uses every 2-3 months, stopped 01/2018   Sexual activity: Yes    Birth control/protection: Condom  Other Topics Concern   Not on file  Social History Narrative   Lives home with parents.  Education in college at [employer].  Fiance- Delaney.  Caffeine - 1 every other day.   Social Determinants of Health   Financial Resource Strain: Not on file  Food Insecurity: Not on file  Transportation Needs: Not on file  Physical Activity: Not on file  Stress: Not on file  Social Connections: Not on file   Additional Social  History:    Pain Medications: see MAR Prescriptions: see MAR Over the Counter: see MAR History of alcohol / drug use?: No history of alcohol / drug abuse      Sleep: Fair  Appetite:  Fair  Current Medications: Current Facility-Administered Medications  Medication Dose Route Frequency Provider Last Rate Last Admin   alum & mag hydroxide-simeth (MAALOX/MYLANTA) 200-200-20 MG/5ML suspension 15 mL  15 mL Oral Q6H PRN Carlyn Reichert, MD       benztropine (COGENTIN) tablet 1 mg  1 mg Oral BID PRN Comer Locket, MD       benztropine (COGENTIN) tablet 1 mg  1 mg Oral BID Mason Jim, Lakiyah Arntson E, MD   1 mg at 11/06/20 0751   escitalopram (LEXAPRO) tablet 30 mg  30 mg Oral QHS Mariel Craft, MD   30 mg at 11/05/20 2116   gabapentin (NEURONTIN) capsule 100 mg  100 mg Oral BID WC Mariel Craft, MD   100 mg at 11/06/20 1158   gabapentin (NEURONTIN) capsule 400 mg  400 mg Oral QHS Mariel Craft, MD   400 mg at 11/05/20 2117   hydrOXYzine (ATARAX/VISTARIL) tablet 25 mg  25 mg Oral Q6H PRN Carlyn Reichert, MD       ibuprofen (ADVIL) tablet 400 mg  400 mg Oral BID PRN Carlyn Reichert, MD   400 mg at 11/04/20 1509   magnesium hydroxide (MILK OF MAGNESIA) suspension 5 mL  5 mL Oral Daily PRN Carlyn Reichert, MD       pantoprazole (PROTONIX) EC tablet 40 mg  40 mg Oral BH-qamhs Mariel Craft, MD   40 mg at 11/06/20 0753   risperiDONE (RISPERDAL) tablet 6 mg  6 mg Oral QHS Mariel Craft, MD   6 mg at 11/05/20 2117   traZODone (DESYREL) tablet 50 mg  50 mg Oral QHS PRN Carlyn Reichert, MD   50 mg at 11/05/20 2116   Vitamin D (Ergocalciferol) (DRISDOL) capsule 50,000 Units  50,000 Units Oral Q7 days Comer Locket, MD   50,000 Units at 11/05/20 0749   zolmitriptan (ZOMIG) nasal solution 1 spray  1 spray Nasal PRN Mariel Craft, MD       zonisamide [facility]) capsule 250 mg  250 mg Oral QHS Mariel Craft, MD   250 mg at 11/05/20 2120    Lab Results:  Results for orders placed or  performed during the hospital encounter of 11/02/20 (from the past 48 hour(s))  Lipid panel     Status: Abnormal   Collection Time: 11/05/20  6:42 AM  Result Value Ref Range   Cholesterol 243 (H) 0 - 200 mg/dL   Triglycerides 366 (H) <150 mg/dL   HDL 37 (L) >44 mg/dL   Total CHOL/HDL Ratio 6.6 RATIO   VLDL 52 (H) 0 - 40 mg/dL   LDL Cholesterol 034 (H) 0 - 99 mg/dL    Comment:  Total Cholesterol/HDL:CHD Risk Coronary Heart Disease Risk Table                     Men   Women  1/2 Average Risk   3.4   3.3  Average Risk       5.0   4.4  2 X Average Risk   9.6   7.1  3 X Average Risk  23.4   11.0        Use the calculated Patient Ratio above and the CHD Risk Table to determine the patient's CHD Risk.        ATP III CLASSIFICATION (LDL):  <100     mg/dL   Optimal  573-220  mg/dL   Near or Above                    Optimal  130-159  mg/dL   Borderline  254-270  mg/dL   High  >623     mg/dL   Very High Performed at [facility], [address] [place], [place] [address]     Blood Alcohol level:  Lab Results  Component Value Date   ETH <10 03/21/2018    Metabolic Disorder Labs: Lab Results  Component Value Date   HGBA1C 5.3 11/03/2020   MPG 105.41 11/03/2020   MPG 96.8 03/22/2018   No results found for: PROLACTIN Lab Results  Component Value Date   CHOL 243 (H) 11/05/2020   TRIG 259 (H) 11/05/2020   HDL 37 (L) 11/05/2020   CHOLHDL 6.6 11/05/2020   VLDL 52 (H) 11/05/2020   LDLCALC 154 (H) 11/05/2020   LDLCALC 119 (H) 11/03/2020    Physical Findings:  Musculoskeletal: Strength & Muscle Tone: within normal limits Gait & Station: normal, steady Patient leans: N/A  Psychiatric Specialty Exam:  Presentation  General Appearance: Appropriate for Environment  Eye Contact:Good  Speech:Clear and Coherent  Speech Volume:Normal  Handedness: No data recorded  Mood and Affect  Mood:Euthymic ("Great, seeing the light at the end of the  tunnel.")  Affect:Congruent   Thought Process  Thought Processes:Coherent; Linear; Goal Directed  Descriptions of Associations:Intact  Orientation:Full (Time, Place and Person)  Thought Content:Logical (Endorsed chronic paranoia that people talk about him but  denies SI/HI/AVH, first rank symptoms. Patient is not grossly responding to internal/external stimuli on exam and did not make delusional statements)  History of Schizophrenia/Schizoaffective disorder:No  Duration of Psychotic Symptoms:Greater than six months  Hallucinations:Hallucinations: Auditory Description of Auditory Hallucinations: voices heard from a distance; mostly quieted  Ideas of Reference:Denied  Suicidal Thoughts:Suicidal Thoughts: No  Homicidal Thoughts:Homicidal Thoughts: No   Sensorium  Memory:Immediate Good; Recent Good; Remote Good  Judgment:Good  Insight:Good   Executive Functions  Concentration:Good  Attention Span:Good  Recall:Good  Fund of Knowledge:Good  Language:Good   Psychomotor Activity  Psychomotor Activity:No stiffness, no cogwheeling, no tremor; AIMS 0   Assets  Assets:Communication Skills; Desire for Improvement; Housing; Intimacy; Physical Health; Social Support; Vocational/Educational   Sleep  Sleep:Sleep: Fair    Physical Exam: Physical Exam Vitals reviewed.  Constitutional:      Appearance: Normal appearance.  HENT:     Head: Normocephalic and atraumatic.  Pulmonary:     Effort: Pulmonary effort is normal.  Musculoskeletal:     Comments: No cogwheel rigidity or tremors noted in BUE  Skin:    General: Skin is warm and dry.  Neurological:     General: No focal deficit present.     Mental  Status: He is alert and oriented to person, place, and time.  Psychiatric:        Attention and Perception: Attention normal. He perceives auditory hallucinations.        Mood and Affect: Affect normal.        Speech: Speech normal. Speech is not rapid and  pressured or tangential.        Behavior: Behavior is not agitated or aggressive. Behavior is cooperative.        Thought Content: Thought content is paranoid. Thought content is not delusional. Thought content does not include homicidal or suicidal ideation.        Cognition and Memory: Cognition and memory normal.   Review of Systems  Respiratory:  Negative for shortness of breath.   Cardiovascular:  Negative for chest pain.  Gastrointestinal:  Negative for constipation, diarrhea, nausea and vomiting.       No further diarrhea after one episode; relieved with Immodium  Psychiatric/Behavioral:  Positive for hallucinations. Negative for depression and suicidal ideas. The patient does not have insomnia.        Distant voices; no command hallucinations; unable to make out what is said  Blood pressure (!) 149/84, pulse 88, temperature 97.6 F (36.4 C), temperature source Oral, resp. rate 18, SpO2 97 %. There is no height or weight on file to calculate BMI.   Treatment Plan Summary: Daily contact with patient to assess and evaluate symptoms and progress in treatment  Safety and Monitoring -- VOLUNTARY admission to inpatient psychiatric unit for safety, stabilization and treatment -- Daily contact with patient to assess and evaluate symptoms and progress in treatment -- Patient's case to be discussed in multi-disciplinary team meeting -- Observation Level : q15 minute checks -- Vital signs:  q12 hours -- Precautions: suicide  SI and MDD w/ Psychotic Fx Patient has been depressed for "years" and began having auditory hallucinations since 03/2018 that are not command nor congruent with his mood. Patient continues to deny SI (since 8/29 per previous notes) and quieted AH to a tolerable level (cessation reported 8/30). --Continue Risperdal 6 mg QHS (Per patient, home dose is 4 mg). Discuss with outpatient provider adjusting dose as deemed appropriate.  Antipsychotic labs             EKG: NSR,  Qtc 437 (8/30), 410 today (9/1)             Lipids: chol 221, TG 356, HDL 31, LDL 119 (recheck 8/31 with similar results)             A1C: 5.3 AIMS: 0, no cogwheeling or rigidity, no tremor or bradykinesia (9/1);  - Continue Cogentin 1 mg BID  -Continue Lexapro 30 mg (patient was taking 20 mg PTA).    Medical Management Covid negative CMP: unremarkable CBC: WNL EtOH: <10 UDS: negative TSH: 1.0 A1C: 5.3 Lipids: as above (will require outpatient management; Naethan wants to attempt lifestyle changes before starting a statin)   Low Vitamin D (26.88) - start Vit D 50,000 units weekly  - will need PCP f/u after discharge   Diarrhea- resolved -Patient given imodium 2 mg one time   Continue Zonisamide 250 mg daily for migraines, and Zomig PRN. Protonix 40 mg BID for GERD Gabapentin 100/100/400 for gagging/vomiting   Continue PRN's: Ibuprofen, Maalox, Atarax, Milk of Magnesia, Trazodone   Dispo: home/self care on 11/07/20 AM. Patient has sleep study scheduled for Sept 2 evening.   Comer LocketAmy E Neaveh Belanger, MD 11/06/2020, 3:28 PM

## 2020-11-06 NOTE — BHH Suicide Risk Assessment (Signed)
[facility] Discharge Suicide Risk Assessment   Principal Problem: Severe episode of recurrent major depressive disorder, with psychotic features [facility]) Discharge Diagnoses: Principal Problem:   Severe episode of recurrent major depressive disorder, with psychotic features (HCC) Active Problems:   Excessive daytime sleepiness   Insomnia due to mental condition   Hypersomnia with sleep apnea   Migraine   Anxiety  Total Time Spent in Direct Patient Care:  I personally spent 30 minutes on the unit in direct patient care. The direct patient care time included face-to-face time with the patient, reviewing the patient's chart, communicating with other professionals, and coordinating care. Greater than 50% of this time was spent in counseling or coordinating care with the patient regarding goals of hospitalization, psycho-education, and discharge planning needs.  Subjective: Patient seen on rounds in his room. He denies SI, HI, VH, intrusive thoughts, delusions, ideas of reference, or first rank symptoms. He states he has had no AH or paranoia since yesterday. He states his mood is stable and improved and he denies medication side-effects. He was advised to watch for dry mouth and constipation on Cogentin and to fluid hydrate and get OTC stool softener as needed. He reports stable sleep and appetite and voices no physical complaints other than occasional HA. He was reminded he will need to work on weight loss, increased exercise, and low fat/low cholesterol diet after discharge given his elevated lipids and triglycerides. He declines start of a lipid lowering medication during admission and was urged to see a PCP after discharge in the next month for recheck and treatment if still elevated. He was reminded he will need ongoing metabolic lab monitoring, EKG, CBC, AIMS, and weight monitoring while on Risperdal. He was specifically cautioned to watch for TD/EPS, dystonia, and galactorrhea while on Risperdal. He was  reminded that his Vitamin D is low and his total bilirubin is mildly elevated and these should be rechecked by a PCP after discharge without fail. He was made aware that he had a potential COVID exposure on the unit and if he develops any clinical symptoms to go get tested in the community without fail.  Musculoskeletal: Strength & Muscle Tone: within normal limits Gait & Station: normal, steady Patient leans: N/A  Psychiatric Specialty Exam: Physical Exam Vitals reviewed.  HENT:     Head: Normocephalic.  Pulmonary:     Effort: Pulmonary effort is normal.  Neurological:     General: No focal deficit present.     Mental Status: He is alert.    Review of Systems  Respiratory:  Negative for shortness of breath.   Cardiovascular:  Negative for chest pain.  Gastrointestinal:  Negative for constipation, diarrhea, nausea and vomiting.  Neurological:  Positive for headaches.   Blood pressure 136/88, pulse 92, temperature 98.6 F (37 C), temperature source Oral, resp. rate 18, SpO2 92 %.There is no height or weight on file to calculate BMI.  General Appearance:  casually dressed, adequate hygiene  Eye Contact:  Good  Speech:  Clear and Coherent and Normal Rate  Volume:  Normal  Mood:  Euthymic  Affect:   moderate, stable, full  Thought Process:  Goal Directed and Linear  Orientation:  Full (Time, Place, and Person)  Thought Content:  Logical and denies current paranoia, AVH, ideas of reference, first rank symptoms, or delusions - no acute psychosis noted on exam  Suicidal Thoughts:  No  Homicidal Thoughts:  No  Memory:  Recent;   Good  Judgement:  Fair  Insight:  Fair  Psychomotor Activity:  Normal, no cogwheeling, no tremor, no akathisias, AIMS 0  Concentration:  Concentration: Good and Attention Span: Good  Recall:  Good  Fund of Knowledge:  Good  Language:  Good  Akathisia:  Negative  Assets:  Communication Skills Desire for Improvement Housing Resilience Social  Support Vocational/Educational  ADL's:  Intact  Cognition:  WNL  Sleep:  Number of Hours: 5.5   Mental Status Per Nursing Assessment::   On Admission:  Suicidal ideation indicated by patient, Self-harm thoughts, AH  Demographic Factors:  Male and Adolescent or young adult  Loss Factors: Parents divorcing  Historical Factors: Previous psychiatric diagnoses/treatment  Risk Reduction Factors:   Sense of responsibility to family, Employed, Living with another person, especially a relative, Positive social support, and Positive coping skills or problem solving skills  Continued Clinical Symptoms:  MDD recurrent severe with psychotic features diagnosis Previous Psychiatric Diagnoses and Treatments  Cognitive Features That Contribute To Risk:  None    Suicide Risk:  Mild:  There are no identifiable plans, no associated intent, mild dysphoria and related symptoms, good self-control (both objective and subjective assessment), few other risk factors, and identifiable protective factors, including available and accessible social support.   Follow-up Information     Dr. Wynelle Fanny, PhD. Nyra Capes on 11/11/2020.   Why: You have an appointment for therapy services on 11/11/20 at 11:00 am.  This appointment will be held in person. Contact information: [facility] [address] [place], [place] [address]  P: [phone], ext. [phone]        [facility]. Go on 11/19/2020.   Specialty: Behavioral Health Why: You have an appointment for medication management services with [name] on 11/19/20 at 5:00 pm.  This appointment will be held IN PERSON.  * PLEASE BRING YOUR DISCHARGE SUMMARY PAPERWORK WITH YOU. Contact information: [address] [place] [address] [phone]         [facility] Follow up.   Why: You may also go to this provider for medication management services. Contact information: [address] [place] [address] [phone]         [facility] Follow up.   Why: You may contact this provider for medication managment and therapy services. They are currently accepting new patients. Contact information: [address] [place] [place], [place] [address] Phone: [phone]        [facility]. Schedule an appointment as soon as possible for a visit.   Why: Please call this provider to schedule an appointment for primary care services. Contact information: [address], [address] [place] [address]                Plan Of Care/Follow-up recommendations:  Activity:  as tolerated Diet:  heart healthy Other:  Patient advised to keep scheduled outpatient mental health follow-up appointments and to comply with medications. He was reminded he will need ongoing AIMS, CBC, EKG, lipid, weight, and glucose monitoring while on an atypical antipsychotic. He was advised to monitor for stiffness, tremor, galactorrhea, or dystonia while on high dose Risperdal. He was advised to fluid hydrate and watch for constipation while on Cogentin. We discussed that as his depression improves and if his psychotic symptoms resolve, his outpatient provider can discuss potential dose reduction in his Risperdal in the future. He was made aware that his lipids and triglycerides are elevated but he declined  a lipid lowering medication during inpatient admission and was encouraged to see a primary care provider without fail for recheck and potential management of lipids in the next month. He was also made aware that his Vitamin D is low and his total bilirubin is mildly elevated and he will need monitoring of these by his primary care provider as an outpatient without fail. He was made aware of a potential COVID exposure on the unit and was advised to get tested in the community immediately if he develops symptoms.  Comer Locket,  MD, FAPA 11/07/2020, 7:35 AM

## 2020-11-06 NOTE — BHH Group Notes (Signed)
Adult Psychoeducational Group Note  Date:  11/06/2020 Time:  2:42 PM  Group Topic/Focus:  Developing a Wellness Toolbox:   The focus of this group is to help patients develop a "wellness toolbox" with skills and strategies to promote recovery upon discharge.  Participation Level:  Active  Participation Quality:  Appropriate  Affect:  Appropriate  Cognitive:  Alert  Insight: Appropriate  Engagement in Group:  Engaged  Modes of Intervention:  Activity  Additional Comments:  Patient attended and participated in the relaxation group.  Jearl Klinefelter 11/06/2020, 2:42 PM

## 2020-11-06 NOTE — Plan of Care (Signed)
  Problem: Education: Goal: Emotional status will improve Outcome: Progressing Goal: Mental status will improve Outcome: Progressing Goal: Verbalization of understanding the information provided will improve Outcome: Progressing   Problem: Activity: Goal: Interest or engagement in activities will improve Outcome: Progressing   

## 2020-11-06 NOTE — Progress Notes (Signed)
  D:  Pt presents with anxiety.  Pt reports increased depression when he thinks about home (fiance and cats).  Pt denies SI/HI, and verbally contracts for safety.  Pt denies AVH.  Pt states is personal goal for today is, "to finish this book."  A:  Medication administered; Pt encouraged to communicate concerns.  R:  Pt remains safe on unit with Q 15 minute security checks.      11/05/20 2116  Psych Admission Type (Psych Patients Only)  Admission Status Voluntary  Psychosocial Assessment  Patient Complaints Anxiety  Eye Contact Fair  Facial Expression Anxious  Affect Depressed  Speech Logical/coherent  Interaction Other (Comment) (wnl)  Motor Activity Other (Comment) (steady)  Appearance/Hygiene Unremarkable  Behavior Characteristics Cooperative;Appropriate to situation  Mood Pleasant;Anxious;Depressed  Thought Process  Coherency WDL  Content WDL  Delusions WDL  Perception WDL  Hallucination None reported or observed  Judgment WDL  Confusion None  Danger to Self  Current suicidal ideation? Denies  Self-Injurious Behavior No self-injurious ideation or behavior indicators observed or expressed   Agreement Not to Harm Self Yes  Description of Agreement verbal contract for safety  Danger to Others  Danger to Others None reported or observed

## 2020-11-07 ENCOUNTER — Ambulatory Visit (HOSPITAL_BASED_OUTPATIENT_CLINIC_OR_DEPARTMENT_OTHER): Payer: BC Managed Care – PPO | Attending: Sleep Medicine | Admitting: Sleep Medicine

## 2020-11-07 ENCOUNTER — Encounter (HOSPITAL_BASED_OUTPATIENT_CLINIC_OR_DEPARTMENT_OTHER): Payer: BC Managed Care – PPO | Admitting: Sleep Medicine

## 2020-11-07 ENCOUNTER — Encounter (HOSPITAL_COMMUNITY): Payer: Self-pay

## 2020-11-07 ENCOUNTER — Other Ambulatory Visit: Payer: Self-pay

## 2020-11-07 DIAGNOSIS — R0683 Snoring: Secondary | ICD-10-CM | POA: Insufficient documentation

## 2020-11-07 DIAGNOSIS — R0681 Apnea, not elsewhere classified: Secondary | ICD-10-CM

## 2020-11-07 DIAGNOSIS — G4719 Other hypersomnia: Secondary | ICD-10-CM

## 2020-11-07 DIAGNOSIS — F333 Major depressive disorder, recurrent, severe with psychotic symptoms: Secondary | ICD-10-CM | POA: Diagnosis not present

## 2020-11-07 MED ORDER — BENZTROPINE MESYLATE 1 MG PO TABS: 1.0000 mg | ORAL_TABLET | Freq: Two times a day (BID) | ORAL | 0 refills | Status: AC

## 2020-11-07 MED ORDER — GABAPENTIN 400 MG PO CAPS: 400.0000 mg | ORAL_CAPSULE | Freq: Every day | ORAL | 0 refills | Status: AC

## 2020-11-07 MED ORDER — TRAZODONE HCL 50 MG PO TABS: 50.0000 mg | ORAL_TABLET | Freq: Every evening | ORAL | 2 refills | Status: AC | PRN

## 2020-11-07 MED ORDER — IBUPROFEN 400 MG PO TABS: 400.0000 mg | ORAL_TABLET | Freq: Two times a day (BID) | ORAL | 0 refills | Status: AC | PRN

## 2020-11-07 MED ORDER — ESCITALOPRAM OXALATE 10 MG PO TABS: 30.0000 mg | ORAL_TABLET | Freq: Every day | ORAL | 0 refills | Status: AC

## 2020-11-07 MED ORDER — GABAPENTIN 100 MG PO CAPS: 100.0000 mg | ORAL_CAPSULE | Freq: Two times a day (BID) | ORAL | 0 refills | Status: AC

## 2020-11-07 MED ORDER — VITAMIN D (ERGOCALCIFEROL) 1.25 MG (50000 UNIT) PO CAPS: 50000.0000 [IU] | ORAL_CAPSULE | ORAL | 0 refills | Status: AC

## 2020-11-07 NOTE — Progress Notes (Signed)
   Pt awakened with complaint of anxiety.  Pt reports anxious about leaving tomorrow and this always happens.  Administered PRN Vistaril per MAR per pt request.

## 2020-11-07 NOTE — Plan of Care (Signed)
  Problem: Physical Regulation: Goal: Ability to maintain clinical measurements within normal limits will improve Outcome: Progressing   Problem: Safety: Goal: Periods of time without injury will increase Outcome: Progressing   Problem: Education: Goal: Ability to make informed decisions regarding treatment will improve Outcome: Progressing   

## 2020-11-07 NOTE — Progress Notes (Signed)
  [facility] Adult Case Management Discharge Plan :  Will you be returning to the same living situation after discharge:  Yes,  Home  At discharge, do you have transportation home?: Yes,  Mother  Do you have the ability to pay for your medications: Yes,  Insurance   Release of information consent forms completed and in the chart;  Patient's signature needed at discharge.  Patient to Follow up at:  Follow-up Information     Dr. Wynelle Fanny, PhD. Nyra Capes on 11/11/2020.   Why: You have an appointment for therapy services on 11/11/20 at 11:00 am.  This appointment will be held in person. Contact information: [facility] [address] [place], [place] [address]  P: [phone], ext. [phone]        [facility]. Go on 11/19/2020.   Specialty: Behavioral Health Why: You have an appointment for medication management services with [name] on 11/19/20 at 5:00 pm.  This appointment will be held IN PERSON.  * PLEASE BRING YOUR DISCHARGE SUMMARY PAPERWORK WITH YOU. Contact information: [address] [place] [address] [phone]         [facility] Follow up.   Why: You may also go to this provider for medication management services. Contact information: [address] [place] [address] [phone]         [facility] Follow up.   Why: You may contact this provider for medication managment and therapy services. They are currently accepting new patients. Contact information: [address] [place], [place] [address] Phone: [phone]        [facility]. Schedule an appointment as soon as possible for a visit.   Why: Please call this provider to schedule an appointment for primary care services. Contact information: [address] [place] [address]                Next level of care provider has access to  [facility] Link:no  Safety Planning and Suicide Prevention discussed: Yes,  with patient      Has patient been referred to the Quitline?: Patient refused referral  Patient has been referred for addiction treatment: N/A  Aram Beecham, LCSWA 11/07/2020, 9:42 AM

## 2020-11-07 NOTE — Plan of Care (Signed)
Kannan called and discussed the potential anticholinergic side effects of taking too much Vistaril and Cogentin. Advised to take the Vistaril only as needed and to discontinue use if he notices any anticholinergic side effects (dry eyes/mouth, flushing, GI problems, etc). Jerrion voiced understanding and was appreciative of the call.   Lamar Sprinkles, MD PGY-1 11/07/2020 [facility] Department of Psychiatry

## 2020-11-07 NOTE — Plan of Care (Signed)
Discharge Note  Patient verbalizes readiness for discharge. Follow up plan explained, AVS, Transition record and SRA given. Prescriptions and teaching provided. Belongings returned and signed for. Suicide safety plan completed and signed. Patient verbalizes understanding. Patient denies SI/HI and assures this Clinical research associate they will seek assistance should that change. Patient discharged to lobby where fiance was waiting.  Problem: Education: Goal: Knowledge of [facility] Education information/materials will improve Outcome: Adequate for Discharge Goal: Emotional status will improve Outcome: Adequate for Discharge Goal: Mental status will improve Outcome: Adequate for Discharge Goal: Verbalization of understanding the information provided will improve Outcome: Adequate for Discharge   Problem: Activity: Goal: Interest or engagement in activities will improve Outcome: Adequate for Discharge Goal: Sleeping patterns will improve Outcome: Adequate for Discharge   Problem: Coping: Goal: Ability to verbalize frustrations and anger appropriately will improve Outcome: Adequate for Discharge Goal: Ability to demonstrate self-control will improve Outcome: Adequate for Discharge   Problem: Health Behavior/Discharge Planning: Goal: Identification of resources available to assist in meeting health care needs will improve Outcome: Adequate for Discharge Goal: Compliance with treatment plan for underlying cause of condition will improve Outcome: Adequate for Discharge   Problem: Physical Regulation: Goal: Ability to maintain clinical measurements within normal limits will improve 11/07/2020 1312 by Raylene Miyamoto, RN Outcome: Adequate for Discharge 11/07/2020 0846 by Raylene Miyamoto, RN Outcome: Progressing   Problem: Safety: Goal: Periods of time without injury will increase 11/07/2020 1312 by Raylene Miyamoto, RN Outcome: Adequate for Discharge 11/07/2020 0846 by Raylene Miyamoto,  RN Outcome: Progressing   Problem: Education: Goal: Ability to make informed decisions regarding treatment will improve 11/07/2020 1312 by Raylene Miyamoto, RN Outcome: Adequate for Discharge 11/07/2020 0846 by Raylene Miyamoto, RN Outcome: Progressing   Problem: Coping: Goal: Coping ability will improve Outcome: Adequate for Discharge   Problem: Health Behavior/Discharge Planning: Goal: Identification of resources available to assist in meeting health care needs will improve Outcome: Adequate for Discharge   Problem: Medication: Goal: Compliance with prescribed medication regimen will improve Outcome: Adequate for Discharge   Problem: Self-Concept: Goal: Ability to disclose and discuss suicidal ideas will improve Outcome: Adequate for Discharge Goal: Will verbalize positive feelings about self Outcome: Adequate for Discharge   Problem: Education: Goal: Utilization of techniques to improve thought processes will improve Outcome: Adequate for Discharge Goal: Knowledge of the prescribed therapeutic regimen will improve Outcome: Adequate for Discharge   Problem: Activity: Goal: Interest or engagement in leisure activities will improve Outcome: Adequate for Discharge Goal: Imbalance in normal sleep/wake cycle will improve Outcome: Adequate for Discharge   Problem: Coping: Goal: Coping ability will improve Outcome: Adequate for Discharge Goal: Will verbalize feelings Outcome: Adequate for Discharge   Problem: Health Behavior/Discharge Planning: Goal: Ability to make decisions will improve Outcome: Adequate for Discharge Goal: Compliance with therapeutic regimen will improve Outcome: Adequate for Discharge   Problem: Role Relationship: Goal: Will demonstrate positive changes in social behaviors and relationships Outcome: Adequate for Discharge   Problem: Safety: Goal: Ability to disclose and discuss suicidal ideas will improve Outcome: Adequate for Discharge Goal:  Ability to identify and utilize support systems that promote safety will improve Outcome: Adequate for Discharge   Problem: Self-Concept: Goal: Will verbalize positive feelings about self Outcome: Adequate for Discharge Goal: Level of anxiety will decrease Outcome: Adequate for Discharge

## 2020-11-07 NOTE — BH IP Treatment Plan (Signed)
Interdisciplinary Treatment and Diagnostic Plan Update  11/07/2020 Time of Session: Did not attend Jeffery Johnson MRN: [id number]  Principal Diagnosis: Severe episode of recurrent major depressive disorder, with psychotic features (HCC)  Secondary Diagnoses: Principal Problem:   Severe episode of recurrent major depressive disorder, with psychotic features (HCC) Active Problems:   Excessive daytime sleepiness   Insomnia due to mental condition   Hypersomnia with sleep apnea   Migraine   Anxiety   Current Medications:  Current Facility-Administered Medications  Medication Dose Route Frequency Provider Last Rate Last Admin   alum & mag hydroxide-simeth (MAALOX/MYLANTA) 200-200-20 MG/5ML suspension 15 mL  15 mL Oral Q6H PRN Carlyn Reichert, MD       benztropine (COGENTIN) tablet 1 mg  1 mg Oral BID PRN Comer Locket, MD       benztropine (COGENTIN) tablet 1 mg  1 mg Oral BID Mason Jim, Amy E, MD   1 mg at 11/07/20 0755   escitalopram (LEXAPRO) tablet 30 mg  30 mg Oral QHS Mariel Craft, MD   30 mg at 11/06/20 2123   gabapentin (NEURONTIN) capsule 100 mg  100 mg Oral BID WC Mariel Craft, MD   100 mg at 11/07/20 0755   gabapentin (NEURONTIN) capsule 400 mg  400 mg Oral QHS Mariel Craft, MD   400 mg at 11/06/20 2124   hydrOXYzine (ATARAX/VISTARIL) tablet 25 mg  25 mg Oral Q6H PRN Carlyn Reichert, MD   25 mg at 11/07/20 0021   ibuprofen (ADVIL) tablet 400 mg  400 mg Oral BID PRN Carlyn Reichert, MD   400 mg at 11/04/20 1509   magnesium hydroxide (MILK OF MAGNESIA) suspension 5 mL  5 mL Oral Daily PRN Carlyn Reichert, MD       pantoprazole (PROTONIX) EC tablet 40 mg  40 mg Oral BH-qamhs Mariel Craft, MD   40 mg at 11/07/20 0755   risperiDONE (RISPERDAL) tablet 6 mg  6 mg Oral QHS Mariel Craft, MD   6 mg at 11/06/20 2124   traZODone (DESYREL) tablet 50 mg  50 mg Oral QHS PRN Carlyn Reichert, MD   50 mg at 11/06/20 2122   Vitamin D (Ergocalciferol) (DRISDOL) capsule 50,000  Units  50,000 Units Oral Q7 days Comer Locket, MD   50,000 Units at 11/05/20 0749   zolmitriptan (ZOMIG) nasal solution 1 spray  1 spray Nasal PRN Mariel Craft, MD       zonisamide [facility]) capsule 250 mg  250 mg Oral QHS Mariel Craft, MD   250 mg at 11/06/20 2123   PTA Medications: Medications Prior to Admission  Medication Sig Dispense Refill Last Dose   albuterol (VENTOLIN HFA) 108 (90 Base) MCG/ACT inhaler Inhale 1 puff into the lungs every 6 (six) hours as needed for wheezing or shortness of breath (Patient uses prior to exercise).      escitalopram (LEXAPRO) 20 MG tablet Take 30 mg by mouth daily.      gabapentin (NEURONTIN) 300 MG capsule Take 300 mg by mouth at bedtime.      ibuprofen (ADVIL) 400 MG tablet Take 400 mg by mouth every 6 (six) hours as needed for mild pain or headache.      omeprazole (PRILOSEC) 40 MG capsule Take 40 mg by mouth 2 (two) times daily before a meal.      hydrOXYzine (ATARAX/VISTARIL) 25 MG tablet Take 1 tablet (25 mg total) by mouth 3 (three) times daily as needed for anxiety. 60 tablet  0    hydrOXYzine (ATARAX/VISTARIL) 25 MG tablet Take 50 mg by mouth at bedtime.      omega-3 acid ethyl esters (LOVAZA) 1 g capsule Take 1 capsule (1 g total) by mouth 2 (two) times daily. For hyperglycemia (Patient not taking: No sig reported) 60 capsule 0 Not Taking   ondansetron (ZOFRAN-ODT) 4 MG disintegrating tablet Take 4 mg by mouth 3 (three) times daily as needed for nausea/vomiting.      risperiDONE (RISPERDAL) 2 MG tablet Take 6 mg by mouth at bedtime.      ZOMIG 5 MG nasal solution as needed.      zonisamide (ZONEGRAN) 100 MG capsule Take 200 mg by mouth at bedtime.      zonisamide (ZONEGRAN) 50 MG capsule Take 50 mg by mouth at bedtime.       Patient Stressors: Educational concerns Other: Patient's parents are going divorcing  Patient Strengths: Ability for insight Average or above average intelligence Communication skills Motivation for  treatment/growth Supportive family/friends  Treatment Modalities: Medication Management, Group therapy, Case management,  1 to 1 session with clinician, Psychoeducation, Recreational therapy.   Physician Treatment Plan for Primary Diagnosis: Severe episode of recurrent major depressive disorder, with psychotic features (HCC) Long Term Goal(s):     Short Term Goals:    Medication Management: Evaluate patient's response, side effects, and tolerance of medication regimen.  Therapeutic Interventions: 1 to 1 sessions, Unit Group sessions and Medication administration.  Evaluation of Outcomes: Adequate for Discharge  Physician Treatment Plan for Secondary Diagnosis: Principal Problem:   Severe episode of recurrent major depressive disorder, with psychotic features (HCC) Active Problems:   Excessive daytime sleepiness   Insomnia due to mental condition   Hypersomnia with sleep apnea   Migraine   Anxiety  Long Term Goal(s):     Short Term Goals:       Medication Management: Evaluate patient's response, side effects, and tolerance of medication regimen.  Therapeutic Interventions: 1 to 1 sessions, Unit Group sessions and Medication administration.  Evaluation of Outcomes: Adequate for Discharge   RN Treatment Plan for Primary Diagnosis: Severe episode of recurrent major depressive disorder, with psychotic features (HCC) Long Term Goal(s): Knowledge of disease and therapeutic regimen to maintain health will improve  Short Term Goals: Ability to remain free from injury will improve, Ability to verbalize frustration and anger appropriately will improve, Ability to demonstrate self-control, Ability to participate in decision making will improve, Ability to verbalize feelings will improve, Ability to disclose and discuss suicidal ideas, Ability to identify and develop effective coping behaviors will improve, and Compliance with prescribed medications will improve  Medication Management:  RN will administer medications as ordered by provider, will assess and evaluate patient's response and provide education to patient for prescribed medication. RN will report any adverse and/or side effects to prescribing provider.  Therapeutic Interventions: 1 on 1 counseling sessions, Psychoeducation, Medication administration, Evaluate responses to treatment, Monitor vital signs and CBGs as ordered, Perform/monitor CIWA, COWS, AIMS and Fall Risk screenings as ordered, Perform wound care treatments as ordered.  Evaluation of Outcomes: Adequate for Discharge   LCSW Treatment Plan for Primary Diagnosis: Severe episode of recurrent major depressive disorder, with psychotic features (HCC) Long Term Goal(s): Safe transition to appropriate next level of care at discharge, Engage patient in therapeutic group addressing interpersonal concerns.  Short Term Goals: Engage patient in aftercare planning with referrals and resources, Increase ability to appropriately verbalize feelings, Increase emotional regulation, Facilitate acceptance of mental health diagnosis and  concerns, Facilitate patient progression through stages of change regarding substance use diagnoses and concerns, Identify triggers associated with mental health/substance abuse issues, and Increase skills for wellness and recovery  Therapeutic Interventions: Assess for all discharge needs, 1 to 1 time with Social worker, Explore available resources and support systems, Assess for adequacy in community support network, Educate family and significant other(s) on suicide prevention, Complete Psychosocial Assessment, Interpersonal group therapy.  Evaluation of Outcomes: Adequate for Discharge   Progress in Treatment: Attending groups: Yes. Participating in groups: Yes. Taking medication as prescribed: Yes. Toleration medication: Yes. Family/Significant other contact made: No, will contact:  CSW attempted to contact patient Fiance, Jeffery Johnson  but unable to contact.  Patient understands diagnosis: Yes. Discussing patient identified problems/goals with staff: Yes. Medical problems stabilized or resolved: Yes. Denies suicidal/homicidal ideation: Yes. Issues/concerns per patient self-inventory: No.  New problem(s) identified: No, Describe:  none reported  New Short Term/Long Term Goal(s): medication stabilization, elimination of SI thoughts, development of comprehensive mental wellness plan.    Patient Goals:  "to be able to get out"  Discharge Plan or Barriers: Patient has follow up med management and therapy appointments.  Patient will be returning home and will be picked up by his mother.   Reason for Continuation of Hospitalization: Other; describe Patient will be discharged today  Estimated Length of Stay: Patient will be discharged today.   Scribe for Treatment Team: Beatris Si, LCSW 11/07/2020 10:55 AM

## 2020-11-07 NOTE — [facility] Suicide Risk Assessment (Signed)
[facility] INPATIENT:  Family/Significant Other Suicide Prevention Education  Suicide Prevention Education:  Contact Attempts: Peggye Pitt [phone] [facility]) has been identified by the patient as the family member/significant other with whom the patient will be residing, and identified as the person(s) who will aid the patient in the event of a mental health crisis.  With written consent from the patient, two attempts were made to provide suicide prevention education, prior to and/or following the patient's discharge.  We were unsuccessful in providing suicide prevention education.  A suicide education pamphlet was given to the patient to share with family/significant other.  Date and time of first attempt: 11/04/2020 at 1:10pm  Date and time of second attempt: 11/06/2020 at 3:41pm   Aram Beecham 11/07/2020, 9:28 AM

## 2020-11-07 NOTE — [facility] Group Notes (Signed)
Attending and participated in group

## 2020-11-07 NOTE — Discharge Summary (Addendum)
Physician Discharge Summary Note  Patient:  Jeffery Johnson is an 25 y.o., male MRN:  [id number] DOB:  Apr 15, 1995 Patient phone:  [phone] (home)  Patient address:   [place] [facility] [address],  Total Time spent with patient:  I personally spent 30 minutes on the unit in direct patient care. The direct patient care time included face-to-face time with the patient, reviewing the patient's chart, communicating with other professionals, and coordinating care. Greater than 50% of this time was spent in counseling or coordinating care with the patient regarding goals of hospitalization, psycho-education, and discharge planning needs.   Date of Admission:  11/02/2020 Date of Discharge: 11/07/2020  Reason for Admission:  Jeffery Johnson is a 25 year old male with a psychiatric history of MDD w/ psychotic fx and GAD who presented voluntarily for SI and auditory hallucinations. See H&P for admission details.  Principal Problem: Severe episode of recurrent major depressive disorder, with psychotic features [facility]) Discharge Diagnoses: Principal Problem:   Severe episode of recurrent major depressive disorder, with psychotic features (HCC) Active Problems:   Excessive daytime sleepiness   Insomnia due to mental condition   Hypersomnia with sleep apnea   Migraine   Anxiety   Past Psychiatric History: [facility] inpatient (2020): SI with plan to cut wrists but no intent and AVH. "Admission H and P reports self-mutilation previously, unclear if this occurred.   Past Medical History:  Past Medical History:  Diagnosis Date   ADHD (attention deficit hyperactivity disorder)    Anxiety    Depression    Excessive daytime sleepiness 07/05/2014   Insomnia due to mental condition 07/05/2014   Migraine    Seasonal allergies    History reviewed. No pertinent surgical history. Family History:  Family History  Problem Relation Age of Onset   Breast cancer Mother    Skin cancer Mother    Stroke Maternal  Grandfather    Heart disease Maternal Grandfather    Diabetes Maternal Grandfather    Stroke Paternal Grandfather    Heart disease Paternal Grandfather    Seizures Maternal Grandmother    Seizures Cousin        mothers side    Family Psychiatric History: Father: bipolar spectrum Mother: Depression Uncle: completed suicide Social History:  Social History   Substance and Sexual Activity  Alcohol Use Not Currently     Social History   Substance and Sexual Activity  Drug Use Not Currently   Comment: uses every 2-3 months, stopped 01/2018    Social History   Socioeconomic History   Marital status: Single    Spouse name: Not on file   Number of children: Not on file   Years of education: Not on file   Highest education level: Some college, no degree  Occupational History   Not on file  Tobacco Use   Smoking status: Former    Types: Cigarettes   Smokeless tobacco: Never   Tobacco comments:    socially  Vaping Use   Vaping Use: Unknown  Substance and Sexual Activity   Alcohol use: Not Currently   Drug use: Not Currently    Comment: uses every 2-3 months, stopped 01/2018   Sexual activity: Yes    Birth control/protection: Condom  Other Topics Concern   Not on file  Social History Narrative   Lives home with parents.  Education in college at [employer].  Fiance- Delaney.  Caffeine - 1 every other day.   Social Determinants of Health   Financial Resource Strain: Not  on file  Food Insecurity: Not on file  Transportation Needs: Not on file  Physical Activity: Not on file  Stress: Not on file  Social Connections: Not on file    Hospital Course:  After evaluation of his presenting symptoms, Jeffery Johnson was recommended for mood stabilization treatments. The medication regimen for his presenting symptoms were discussed & with his consent initiated. He received, stabilized & was discharged on the medications as listed below on his discharge medication lists. He was also enrolled &  participated in the group counseling sessions being offered & held on this unit. He learned coping skills. Jeffery Johnson presented with on this admission, other chronic medical conditions that required treatment & monitoring. He was resumed/discharged on all his pertinent home medications for those health issues. He tolerated his treatment regimen without any adverse effects or reactions reported.   Jeffery Johnson has been able to achieve symptom control under an antipsychotic monotherapy. On this present admission, he received & was discharged on one antipsychotic medication, Risperdal, as well as antidepressant, Lexapro. He was also prescribed Cogentin to help with any extrapyramidal symptoms, as he experienced cogwheel rigidity, which was resolved with the initiation of Cogentin.   During the course of his hospitalization, the 15-minute checks were adequate to ensure Jeffery Johnson's safety.  Patient did not display any dangerous, violent or suicidal behavior on the unit.  He interacted with patients & staff appropriately, participated appropriately in the group sessions/therapies. His medications were addressed & adjusted to meet his needs. He was recommended for outpatient follow-up care & medication management upon discharge to assure his continuity of care.  At the time of this hospital discharge, patient is not reporting any acute suicidal/homicidal ideations. He feels more confident about his mental state. He currently denies any new issues or concerns. Education and supportive counseling provided throughout his hospital stay & upon discharge.   Today upon his discharge evaluation with the attending psychiatrist, Jeffery Johnson shares he is doing well. He denies any other specific concerns. He is sleeping well. His appetite is good. He denies other physical complaints. He denies AH/VH. He feels that his medications have been helpful & is in agreement to continue his current treatment regimen as recommended. He was able to engage in  safety planning including plan to return to [facility] or contact emergency services if he feels unable to maintain his own safety or the safety of others. Pt had no further questions, comments, or concerns. He left [facility] with all personal belongings in no apparent distress. Transportation provided by his fiancee.     Physical Findings: AIMS: Facial and Oral Movements Muscles of Facial Expression: None, normal Lips and Perioral Area: None, normal Jaw: None, normal Tongue: None, normal,Extremity Movements Upper (arms, wrists, hands, fingers): None, normal Lower (legs, knees, ankles, toes): None, normal, Trunk Movements Neck, shoulders, hips: None, normal, Overall Severity Severity of abnormal movements (highest score from questions above): None, normal Incapacitation due to abnormal movements: None, normal Patient's awareness of abnormal movements (rate only patient's report): No Awareness, Dental Status Current problems with teeth and/or dentures?: No Does patient usually wear dentures?: No    Musculoskeletal: Strength & Muscle Tone: within normal limits Gait & Station: normal, steady Patient leans: N/A Psychiatric Specialty Exam: Physical Exam Vitals reviewed.  HENT:     Head: Normocephalic.  Pulmonary:     Effort: Pulmonary effort is normal.  Neurological:     General: No focal deficit present.     Mental Status: He is alert.  Review of Systems  Respiratory:  Negative for shortness of breath.   Cardiovascular:  Negative for chest pain.  Gastrointestinal:  Negative for constipation, diarrhea, nausea and vomiting.  Neurological:  Positive for headaches.   Blood pressure 136/88, pulse 92, temperature 98.6 F (37 C), temperature source Oral, resp. rate 18, SpO2 92 %.There is no height or weight on file to calculate BMI.  General Appearance:  casually dressed, adequate hygiene  Eye Contact:  Good  Speech:  Clear and Coherent and Normal Rate  Volume:  Normal  Mood:  Euthymic   Affect:   moderate, stable, full  Thought Process:  Goal Directed and Linear  Orientation:  Full (Time, Place, and Person)  Thought Content:  Logical and denies current paranoia, AVH, ideas of reference, first rank symptoms, or delusions - no acute psychosis noted on exam  Suicidal Thoughts:  No  Homicidal Thoughts:  No  Memory:  Recent;   Good  Judgement:  Fair  Insight:  Fair  Psychomotor Activity:  Normal, no cogwheeling, no tremor, no akathisias, AIMS 0  Concentration:  Concentration: Good and Attention Span: Good  Recall:  Good  Fund of Knowledge:  Good  Language:  Good  Akathisia:  Negative  Assets:  Communication Skills Desire for Improvement Housing Resilience Social Support Vocational/Educational  ADL's:  Intact  Cognition:  WNL  Sleep:  Number of Hours: 5.5     Social History   Tobacco Use  Smoking Status Former   Types: Cigarettes  Smokeless Tobacco Never  Tobacco Comments   socially   Tobacco Cessation:  N/A, patient does not currently use tobacco products   Blood Alcohol level:  Lab Results  Component Value Date   ETH <10 03/21/2018    Metabolic Disorder Labs:  Lab Results  Component Value Date   HGBA1C 5.3 11/03/2020   MPG 105.41 11/03/2020   MPG 96.8 03/22/2018   No results found for: PROLACTIN Lab Results  Component Value Date   CHOL 243 (H) 11/05/2020   TRIG 259 (H) 11/05/2020   HDL 37 (L) 11/05/2020   CHOLHDL 6.6 11/05/2020   VLDL 52 (H) 11/05/2020   LDLCALC 154 (H) 11/05/2020   LDLCALC 119 (H) 11/03/2020    See Psychiatric Specialty Exam and Suicide Risk Assessment completed by Attending Physician prior to discharge.  Discharge destination:  Home  Is patient on multiple antipsychotic therapies at discharge:  No   Has Patient had three or more failed trials of antipsychotic monotherapy by history:  No  Recommended Plan for Multiple Antipsychotic Therapies: NA   Allergies as of 11/07/2020       Reactions   Topamax  [topiramate] Other (See Comments)   Pt reports it causes problems with is memory        Medication List     STOP taking these medications    omega-3 acid ethyl esters 1 g capsule Commonly known as: LOVAZA       TAKE these medications      Indication  albuterol 108 (90 Base) MCG/ACT inhaler Commonly known as: VENTOLIN HFA Inhale 1 puff into the lungs every 6 (six) hours as needed for wheezing or shortness of breath (Patient uses prior to exercise).  Indication: Asthma   benztropine 1 MG tablet Commonly known as: COGENTIN Take 1 tablet (1 mg total) by mouth 2 (two) times daily.  Indication: Extrapyramidal Reaction caused by Medications   escitalopram 10 MG tablet Commonly known as: LEXAPRO Take 3 tablets (30 mg total) by mouth at  bedtime. What changed:  medication strength when to take this  Indication: Major Depressive Disorder   gabapentin 100 MG capsule Commonly known as: NEURONTIN Take 1 capsule (100 mg total) by mouth 2 (two) times daily with breakfast and lunch. What changed:  medication strength how much to take when to take this  Indication: Neuropathic Pain   gabapentin 400 MG capsule Commonly known as: NEURONTIN Take 1 capsule (400 mg total) by mouth at bedtime. What changed: You were already taking a medication with the same name, and this prescription was added. Make sure you understand how and when to take each.  Indication: Neuropathic Pain   hydrOXYzine 25 MG tablet Commonly known as: ATARAX/VISTARIL Take 1 tablet (25 mg total) by mouth 3 (three) times daily as needed for anxiety. What changed: Another medication with the same name was removed. Continue taking this medication, and follow the directions you see here.  Indication: Feeling Anxious   ibuprofen 400 MG tablet Commonly known as: ADVIL Take 1 tablet (400 mg total) by mouth 2 (two) times daily as needed for mild pain or headache. What changed: when to take this  Indication: Migraine  Headache   omeprazole 40 MG capsule Commonly known as: PRILOSEC Take 40 mg by mouth 2 (two) times daily before a meal.  Indication: Gastroesophageal Reflux Disease   ondansetron 4 MG disintegrating tablet Commonly known as: ZOFRAN-ODT Take 4 mg by mouth 3 (three) times daily as needed for nausea/vomiting.  Indication: Nausea and Vomiting   risperiDONE 2 MG tablet Commonly known as: RISPERDAL Take 6 mg by mouth at bedtime.  Indication: Major Depressive Disorder   traZODone 50 MG tablet Commonly known as: DESYREL Take 1 tablet (50 mg total) by mouth at bedtime as needed for sleep.  Indication: Trouble Sleeping   Vitamin D (Ergocalciferol) 1.25 MG (50000 UNIT) Caps capsule Commonly known as: DRISDOL Take 1 capsule (50,000 Units total) by mouth every 7 (seven) days. Start taking on: November 11, 2020  Indication: Vitamin D Deficiency   Zomig 5 MG nasal solution Generic drug: zolmitriptan as needed.  Indication: Migraine Headache   zonisamide 100 MG capsule Commonly known as: ZONEGRAN Take 200 mg by mouth at bedtime.  Indication: migraine, antipsychotic-induced weight gain   zonisamide 50 MG capsule Commonly known as: ZONEGRAN Take 50 mg by mouth at bedtime.  Indication: migraine, antipsychotic-induced weight gain        Follow-up Information     Dr. Wynelle Fanny, PhD. Go on 11/11/2020.   Why: You have an appointment for therapy services on 11/11/20 at 11:00 am.  This appointment will be held in person. Contact information: [facility] [address] [place], [place] [address]  P: [phone], ext. [phone]        [facility]. Go on 11/19/2020.   Specialty: Behavioral Health Why: You have an appointment for medication management services with [name] on 11/19/20 at 5:00 pm.  This appointment will be held IN PERSON.  * PLEASE BRING YOUR DISCHARGE SUMMARY PAPERWORK WITH YOU. Contact information: [address] [place] [address] [phone]         [facility] Follow up.   Why: You may also go to this provider for medication management services. Contact information: [address] [place] [address] [phone]         [facility] Follow up.   Why: You may contact this provider for medication managment and therapy  services. They are currently accepting new patients. Contact information: [phone] [place] [place], [place] [address] Phone: [phone]        [facility]. Schedule an appointment as soon as possible for a visit.   Why: Please call this provider to schedule an appointment for primary care services. Contact information: [address], [address] [place] [address]                Follow-up recommendations:  Activity:  Increased exercise, as tolerated Diet:  Low fat/ low cholesterol.  Comments: Follow up with all scheduled psychiatry, therapy, and primary care appointments.  Should any psychiatric emergencies arise, call 911, the National Suicide Prevention Hotline at 75, visit your nearest emergency department, or visit the [facility], open 24 hours a day, 7 days a week (Address:  [address], in [place], [address] Ph: [phone]).    Signed: Lamar Sprinkles, MD 11/07/2020, 10:09 AM

## 2020-11-07 NOTE — Progress Notes (Signed)
Recreation Therapy Notes  Date:  9.2.22 Time: 0930 Location: [address] Dayroom  Group Topic: Stress Management  Goal Area(s) Addresses:  Patient will identify positive stress management techniques. Patient will identify benefits of using stress management post d/c.  Behavioral Response: Attentive  Intervention: Stress Management  Activity :  LRT played a meditation that focused on setting healthy boundaries even if that upsets the people around you.  Education:  Stress Management, Discharge Planning.   Education Outcome: Acknowledges Education  Clinical Observations/Feedback: Pt attended and participated.  Pt had no questions or concerns.    Caroll Rancher, LRT/CTRS         Caroll Rancher A 11/07/2020 11:35 AM

## 2020-11-11 DIAGNOSIS — F341 Dysthymic disorder: Secondary | ICD-10-CM | POA: Diagnosis not present

## 2020-11-17 NOTE — Procedures (Signed)
   NAME: Jeffery Johnson DATE OF BIRTH:  06/08/1995 MEDICAL RECORD NUMBER [id number]  LOCATION: [facility]  PHYSICIAN:  D   DATE OF STUDY: 11/07/2020  SLEEP STUDY TYPE: Nocturnal Polysomnogram               REFERRING PHYSICIAN: Alanda Slim, MD  CLINICAL INFORMATION Dyrell Tuccillo is a 25 year old Male and was referred to the sleep center for evaluation of obstructive sleep apnea.  MEDICATIONS Patient self administered medications include: Zonisamide, Omeprazole, Risperidone, Lexapro, Gabapentin, Klonopin, Hydroxyzine, Melatonin. No sleep medications administered during study.  SLEEP STUDY TECHNIQUE A multi-channel overnight Polysomnography study was performed. The channels recorded and monitored were central and occipital EEG, electrooculogram (EOG), submentalis EMG (chin), nasal and oral airflow, thoracic and abdominal wall motion, anterior tibialis EMG, snore microphone, electrocardiogram, and a pulse oximetry. TECHNICAL COMMENTS Comments added by Technician: Patient had difficulty initiating sleep. SLEEP ARCHITECTURE The study was initiated at 9:47:03 PM and terminated at 4:40:37 AM. The total recorded time was 413.6 minutes. EEG confirmed total sleep time was 303 minutes yielding a sleep efficiency of 73.3%. Sleep onset after lights out was 12.4 minutes with a REM latency of 261.5 minutes. The patient spent 1.2% of the night in stage N1 sleep, 92.7% in stage N2 sleep, 0.0% in stage N3 and 6.1% in REM. Wake after sleep onset (WASO) was 98.2 minutes. The Arousal Index was 1.8/hour. RESPIRATORY PARAMETERS There were a total of 2 respiratory disturbances out of which 0 were apneas (0 obstructive, 0 mixed, 0 central) and 2 hypopneas. The apnea/hypopnea index (AHI) was 0.4 events/hour. The central sleep apnea index was 0 events/hour. The REM AHI was 0.0 events/hour and NREM AHI was 0.4 events/hour. The supine AHI was 0.6 events/hour and the non supine AHI was 0  supine during 70.31% of sleep. Respiratory disturbance index was 0.6 events/hour and associated with oxygen desaturation down to a nadir of 89.0% during sleep. The mean oxygen saturation during the study was 94.8%.   LEG MOVEMENT DATA The total leg movements were 0 with a resulting leg movement index of 0.0/hr .Associated arousal with leg movement index was 0.0/hr.  CARDIAC DATA The underlying cardiac rhythm was most consistent with sinus rhythm. Mean heart rate during sleep was 60.2 bpm. IMPRESSIONS - No Significant Obstructive Sleep apnea (OSA) - The patient snored with moderate snoring volume. DIAGNOSIS - Primary Snoring (R06.83) RECOMMENDATIONS - Recommend a trial of Oral Appliance for snoring. - Avoid alcohol, sedatives and other CNS depressants that may worsen sleep apnea and disrupt normal sleep architecture. - Sleep hygiene should be reviewed to assess factors that may improve sleep quality. - Weight management and regular exercise should be initiated or continued.   D  Diplomate, American Board of Internal Medicine  ELECTRONICALLY SIGNED ON:  11/17/2020, 11:36 AM [facility] PH: [phone]   FX: [phone] ACCREDITED BY THE AMERICAN ACADEMY OF SLEEP MEDICINE

## 2020-11-19 DIAGNOSIS — F250 Schizoaffective disorder, bipolar type: Secondary | ICD-10-CM | POA: Diagnosis not present

## 2020-11-19 DIAGNOSIS — F341 Dysthymic disorder: Secondary | ICD-10-CM | POA: Diagnosis not present

## 2020-11-19 DIAGNOSIS — F411 Generalized anxiety disorder: Secondary | ICD-10-CM | POA: Diagnosis not present

## 2020-11-19 DIAGNOSIS — G4700 Insomnia, unspecified: Secondary | ICD-10-CM | POA: Diagnosis not present

## 2020-11-19 DIAGNOSIS — F900 Attention-deficit hyperactivity disorder, predominantly inattentive type: Secondary | ICD-10-CM | POA: Diagnosis not present

## 2020-11-21 DIAGNOSIS — E7800 Pure hypercholesterolemia, unspecified: Secondary | ICD-10-CM | POA: Diagnosis not present

## 2020-11-21 DIAGNOSIS — Z6833 Body mass index (BMI) 33.0-33.9, adult: Secondary | ICD-10-CM | POA: Diagnosis not present

## 2020-11-21 DIAGNOSIS — F329 Major depressive disorder, single episode, unspecified: Secondary | ICD-10-CM | POA: Diagnosis not present

## 2020-11-25 DIAGNOSIS — F341 Dysthymic disorder: Secondary | ICD-10-CM | POA: Diagnosis not present

## 2020-12-02 DIAGNOSIS — F341 Dysthymic disorder: Secondary | ICD-10-CM | POA: Diagnosis not present

## 2020-12-04 DIAGNOSIS — G43019 Migraine without aura, intractable, without status migrainosus: Secondary | ICD-10-CM | POA: Diagnosis not present

## 2020-12-04 DIAGNOSIS — G43719 Chronic migraine without aura, intractable, without status migrainosus: Secondary | ICD-10-CM | POA: Diagnosis not present

## 2020-12-16 DIAGNOSIS — F341 Dysthymic disorder: Secondary | ICD-10-CM | POA: Diagnosis not present

## 2020-12-17 DIAGNOSIS — J3089 Other allergic rhinitis: Secondary | ICD-10-CM | POA: Diagnosis not present

## 2020-12-17 DIAGNOSIS — J4520 Mild intermittent asthma, uncomplicated: Secondary | ICD-10-CM | POA: Diagnosis not present

## 2020-12-17 DIAGNOSIS — D802 Selective deficiency of immunoglobulin A [IgA]: Secondary | ICD-10-CM | POA: Diagnosis not present

## 2020-12-17 DIAGNOSIS — J301 Allergic rhinitis due to pollen: Secondary | ICD-10-CM | POA: Diagnosis not present

## 2020-12-22 DIAGNOSIS — R070 Pain in throat: Secondary | ICD-10-CM | POA: Diagnosis not present

## 2020-12-22 DIAGNOSIS — K219 Gastro-esophageal reflux disease without esophagitis: Secondary | ICD-10-CM | POA: Diagnosis not present

## 2020-12-23 DIAGNOSIS — R0683 Snoring: Secondary | ICD-10-CM | POA: Diagnosis not present

## 2020-12-23 DIAGNOSIS — G4719 Other hypersomnia: Secondary | ICD-10-CM | POA: Diagnosis not present

## 2020-12-24 DIAGNOSIS — G4700 Insomnia, unspecified: Secondary | ICD-10-CM | POA: Diagnosis not present

## 2020-12-24 DIAGNOSIS — F250 Schizoaffective disorder, bipolar type: Secondary | ICD-10-CM | POA: Diagnosis not present

## 2020-12-24 DIAGNOSIS — F900 Attention-deficit hyperactivity disorder, predominantly inattentive type: Secondary | ICD-10-CM | POA: Diagnosis not present

## 2020-12-29 DIAGNOSIS — F341 Dysthymic disorder: Secondary | ICD-10-CM | POA: Diagnosis not present

## 2021-01-05 DIAGNOSIS — N5314 Retrograde ejaculation: Secondary | ICD-10-CM | POA: Diagnosis not present

## 2021-01-27 DIAGNOSIS — F341 Dysthymic disorder: Secondary | ICD-10-CM | POA: Diagnosis not present

## 2021-02-16 DIAGNOSIS — F341 Dysthymic disorder: Secondary | ICD-10-CM | POA: Diagnosis not present

## 2021-02-18 DIAGNOSIS — G4700 Insomnia, unspecified: Secondary | ICD-10-CM | POA: Diagnosis not present

## 2021-02-18 DIAGNOSIS — K222 Esophageal obstruction: Secondary | ICD-10-CM | POA: Diagnosis not present

## 2021-02-18 DIAGNOSIS — F900 Attention-deficit hyperactivity disorder, predominantly inattentive type: Secondary | ICD-10-CM | POA: Diagnosis not present

## 2021-02-18 DIAGNOSIS — F250 Schizoaffective disorder, bipolar type: Secondary | ICD-10-CM | POA: Diagnosis not present

## 2021-02-18 DIAGNOSIS — R112 Nausea with vomiting, unspecified: Secondary | ICD-10-CM | POA: Diagnosis not present

## 2021-02-24 DIAGNOSIS — E7800 Pure hypercholesterolemia, unspecified: Secondary | ICD-10-CM | POA: Diagnosis not present

## 2021-02-24 DIAGNOSIS — E559 Vitamin D deficiency, unspecified: Secondary | ICD-10-CM | POA: Diagnosis not present

## 2021-03-10 DIAGNOSIS — F341 Dysthymic disorder: Secondary | ICD-10-CM | POA: Diagnosis not present

## 2021-04-06 DIAGNOSIS — T182XXA Foreign body in stomach, initial encounter: Secondary | ICD-10-CM | POA: Diagnosis not present

## 2021-04-06 DIAGNOSIS — K222 Esophageal obstruction: Secondary | ICD-10-CM | POA: Diagnosis not present

## 2021-04-06 DIAGNOSIS — R112 Nausea with vomiting, unspecified: Secondary | ICD-10-CM | POA: Diagnosis not present

## 2021-04-10 DIAGNOSIS — E559 Vitamin D deficiency, unspecified: Secondary | ICD-10-CM | POA: Diagnosis not present

## 2021-04-10 DIAGNOSIS — E785 Hyperlipidemia, unspecified: Secondary | ICD-10-CM | POA: Diagnosis not present

## 2021-04-16 DIAGNOSIS — F411 Generalized anxiety disorder: Secondary | ICD-10-CM | POA: Diagnosis not present

## 2021-04-16 DIAGNOSIS — F3341 Major depressive disorder, recurrent, in partial remission: Secondary | ICD-10-CM | POA: Diagnosis not present

## 2021-04-16 DIAGNOSIS — F900 Attention-deficit hyperactivity disorder, predominantly inattentive type: Secondary | ICD-10-CM | POA: Diagnosis not present

## 2021-04-16 DIAGNOSIS — F952 Tourette's disorder: Secondary | ICD-10-CM | POA: Diagnosis not present

## 2021-04-30 DIAGNOSIS — G478 Other sleep disorders: Secondary | ICD-10-CM | POA: Diagnosis not present

## 2021-04-30 DIAGNOSIS — R0683 Snoring: Secondary | ICD-10-CM | POA: Diagnosis not present

## 2021-04-30 DIAGNOSIS — G4719 Other hypersomnia: Secondary | ICD-10-CM | POA: Diagnosis not present

## 2021-04-30 DIAGNOSIS — R070 Pain in throat: Secondary | ICD-10-CM | POA: Diagnosis not present

## 2021-04-30 DIAGNOSIS — K219 Gastro-esophageal reflux disease without esophagitis: Secondary | ICD-10-CM | POA: Diagnosis not present

## 2021-05-01 DIAGNOSIS — G478 Other sleep disorders: Secondary | ICD-10-CM | POA: Diagnosis not present

## 2021-05-01 DIAGNOSIS — R0681 Apnea, not elsewhere classified: Secondary | ICD-10-CM | POA: Diagnosis not present

## 2021-05-01 DIAGNOSIS — G4719 Other hypersomnia: Secondary | ICD-10-CM | POA: Diagnosis not present

## 2021-05-01 DIAGNOSIS — R0683 Snoring: Secondary | ICD-10-CM | POA: Diagnosis not present

## 2021-06-04 DIAGNOSIS — F411 Generalized anxiety disorder: Secondary | ICD-10-CM | POA: Diagnosis not present

## 2021-06-04 DIAGNOSIS — F952 Tourette's disorder: Secondary | ICD-10-CM | POA: Diagnosis not present

## 2021-06-04 DIAGNOSIS — F900 Attention-deficit hyperactivity disorder, predominantly inattentive type: Secondary | ICD-10-CM | POA: Diagnosis not present

## 2021-06-04 DIAGNOSIS — F3341 Major depressive disorder, recurrent, in partial remission: Secondary | ICD-10-CM | POA: Diagnosis not present

## 2021-06-12 DIAGNOSIS — R112 Nausea with vomiting, unspecified: Secondary | ICD-10-CM | POA: Diagnosis not present

## 2021-06-12 DIAGNOSIS — K222 Esophageal obstruction: Secondary | ICD-10-CM | POA: Diagnosis not present

## 2021-06-12 DIAGNOSIS — K449 Diaphragmatic hernia without obstruction or gangrene: Secondary | ICD-10-CM | POA: Diagnosis not present

## 2021-06-24 DIAGNOSIS — G43019 Migraine without aura, intractable, without status migrainosus: Secondary | ICD-10-CM | POA: Diagnosis not present

## 2021-06-24 DIAGNOSIS — G43719 Chronic migraine without aura, intractable, without status migrainosus: Secondary | ICD-10-CM | POA: Diagnosis not present

## 2021-07-08 DIAGNOSIS — K200 Eosinophilic esophagitis: Secondary | ICD-10-CM | POA: Diagnosis not present

## 2021-07-10 DIAGNOSIS — E559 Vitamin D deficiency, unspecified: Secondary | ICD-10-CM | POA: Diagnosis not present

## 2021-07-10 DIAGNOSIS — E7800 Pure hypercholesterolemia, unspecified: Secondary | ICD-10-CM | POA: Diagnosis not present

## 2021-07-10 DIAGNOSIS — F329 Major depressive disorder, single episode, unspecified: Secondary | ICD-10-CM | POA: Diagnosis not present

## 2021-07-30 DIAGNOSIS — F900 Attention-deficit hyperactivity disorder, predominantly inattentive type: Secondary | ICD-10-CM | POA: Diagnosis not present

## 2021-07-30 DIAGNOSIS — F3341 Major depressive disorder, recurrent, in partial remission: Secondary | ICD-10-CM | POA: Diagnosis not present

## 2021-07-30 DIAGNOSIS — F411 Generalized anxiety disorder: Secondary | ICD-10-CM | POA: Diagnosis not present

## 2021-09-10 DIAGNOSIS — F900 Attention-deficit hyperactivity disorder, predominantly inattentive type: Secondary | ICD-10-CM | POA: Diagnosis not present

## 2021-09-10 DIAGNOSIS — F952 Tourette's disorder: Secondary | ICD-10-CM | POA: Diagnosis not present

## 2021-09-10 DIAGNOSIS — K529 Noninfective gastroenteritis and colitis, unspecified: Secondary | ICD-10-CM | POA: Diagnosis not present

## 2021-09-10 DIAGNOSIS — F250 Schizoaffective disorder, bipolar type: Secondary | ICD-10-CM | POA: Diagnosis not present

## 2021-10-13 DIAGNOSIS — F952 Tourette's disorder: Secondary | ICD-10-CM | POA: Diagnosis not present

## 2021-10-13 DIAGNOSIS — F900 Attention-deficit hyperactivity disorder, predominantly inattentive type: Secondary | ICD-10-CM | POA: Diagnosis not present

## 2021-10-13 DIAGNOSIS — F250 Schizoaffective disorder, bipolar type: Secondary | ICD-10-CM | POA: Diagnosis not present

## 2021-12-09 DIAGNOSIS — F900 Attention-deficit hyperactivity disorder, predominantly inattentive type: Secondary | ICD-10-CM | POA: Diagnosis not present

## 2021-12-09 DIAGNOSIS — F952 Tourette's disorder: Secondary | ICD-10-CM | POA: Diagnosis not present

## 2021-12-09 DIAGNOSIS — F250 Schizoaffective disorder, bipolar type: Secondary | ICD-10-CM | POA: Diagnosis not present

## 2021-12-16 DIAGNOSIS — G43019 Migraine without aura, intractable, without status migrainosus: Secondary | ICD-10-CM | POA: Diagnosis not present

## 2022-01-21 DIAGNOSIS — F250 Schizoaffective disorder, bipolar type: Secondary | ICD-10-CM | POA: Diagnosis not present

## 2022-01-21 DIAGNOSIS — F900 Attention-deficit hyperactivity disorder, predominantly inattentive type: Secondary | ICD-10-CM | POA: Diagnosis not present

## 2022-01-21 DIAGNOSIS — F952 Tourette's disorder: Secondary | ICD-10-CM | POA: Diagnosis not present

## 2022-01-21 DIAGNOSIS — F411 Generalized anxiety disorder: Secondary | ICD-10-CM | POA: Diagnosis not present

## 2022-02-17 DIAGNOSIS — R110 Nausea: Secondary | ICD-10-CM | POA: Diagnosis not present
